# Patient Record
Sex: Male | Born: 1945
Health system: Southern US, Community
[De-identification: ages and names within clinical notes are randomized; demographics above are authoritative.]

## PROBLEM LIST (undated history)

## (undated) DIAGNOSIS — I2510 Atherosclerotic heart disease of native coronary artery without angina pectoris: Secondary | ICD-10-CM

## (undated) DIAGNOSIS — N319 Neuromuscular dysfunction of bladder, unspecified: Secondary | ICD-10-CM

## (undated) DIAGNOSIS — L601 Onycholysis: Secondary | ICD-10-CM

## (undated) HISTORY — DX: Atherosclerotic heart disease of native coronary artery without angina pectoris: I25.10

## (undated) HISTORY — PX: CATARACT EXTRACTION: SUR2

## (undated) HISTORY — DX: Neuromuscular dysfunction of bladder, unspecified: N31.9

## (undated) HISTORY — DX: Onycholysis: L60.1

---

## 2001-01-06 ENCOUNTER — Encounter: Payer: Self-pay | Admitting: Urology

## 2001-01-06 ENCOUNTER — Encounter: Admission: RE | Admit: 2001-01-06 | Discharge: 2001-01-06 | Payer: Self-pay | Admitting: Urology

## 2008-10-24 ENCOUNTER — Encounter: Payer: Self-pay | Admitting: Emergency Medicine

## 2008-10-24 ENCOUNTER — Ambulatory Visit: Payer: Self-pay | Admitting: Radiology

## 2008-10-24 ENCOUNTER — Ambulatory Visit: Payer: Self-pay | Admitting: *Deleted

## 2008-10-24 ENCOUNTER — Inpatient Hospital Stay (HOSPITAL_COMMUNITY): Admission: EM | Admit: 2008-10-24 | Discharge: 2008-10-28 | Payer: Self-pay | Admitting: Emergency Medicine

## 2008-10-26 ENCOUNTER — Encounter: Admission: RE | Admit: 2008-10-26 | Discharge: 2008-10-27 | Payer: Self-pay | Admitting: Cardiology

## 2008-10-26 ENCOUNTER — Encounter: Payer: Self-pay | Admitting: Cardiovascular Disease

## 2008-10-26 DIAGNOSIS — I2510 Atherosclerotic heart disease of native coronary artery without angina pectoris: Secondary | ICD-10-CM

## 2008-10-26 HISTORY — DX: Atherosclerotic heart disease of native coronary artery without angina pectoris: I25.10

## 2008-10-28 ENCOUNTER — Encounter: Payer: Self-pay | Admitting: Cardiology

## 2008-11-01 ENCOUNTER — Encounter: Payer: Self-pay | Admitting: Cardiovascular Disease

## 2008-11-05 DIAGNOSIS — I219 Acute myocardial infarction, unspecified: Secondary | ICD-10-CM | POA: Insufficient documentation

## 2008-11-05 DIAGNOSIS — R079 Chest pain, unspecified: Secondary | ICD-10-CM

## 2008-11-05 DIAGNOSIS — I2510 Atherosclerotic heart disease of native coronary artery without angina pectoris: Secondary | ICD-10-CM | POA: Insufficient documentation

## 2008-11-08 ENCOUNTER — Ambulatory Visit: Payer: Self-pay | Admitting: Cardiovascular Disease

## 2008-11-08 DIAGNOSIS — E785 Hyperlipidemia, unspecified: Secondary | ICD-10-CM | POA: Insufficient documentation

## 2008-11-08 DIAGNOSIS — I2510 Atherosclerotic heart disease of native coronary artery without angina pectoris: Secondary | ICD-10-CM

## 2008-11-11 ENCOUNTER — Encounter (HOSPITAL_COMMUNITY): Admission: RE | Admit: 2008-11-11 | Discharge: 2009-02-09 | Payer: Self-pay | Admitting: Cardiovascular Disease

## 2008-11-17 ENCOUNTER — Ambulatory Visit: Payer: Self-pay | Admitting: Cardiology

## 2008-11-30 ENCOUNTER — Encounter: Payer: Self-pay | Admitting: Cardiovascular Disease

## 2008-12-22 ENCOUNTER — Encounter: Payer: Self-pay | Admitting: Cardiovascular Disease

## 2009-01-04 ENCOUNTER — Encounter: Payer: Self-pay | Admitting: Cardiovascular Disease

## 2009-01-10 ENCOUNTER — Telehealth: Payer: Self-pay | Admitting: Cardiovascular Disease

## 2009-01-17 ENCOUNTER — Ambulatory Visit: Payer: Self-pay | Admitting: Cardiovascular Disease

## 2009-01-17 LAB — CONVERTED CEMR LAB
ALT: 33 units/L (ref 0–53)
AST: 32 units/L (ref 0–37)
Albumin: 4 g/dL (ref 3.5–5.2)
Alkaline Phosphatase: 47 units/L (ref 39–117)
Bilirubin, Direct: 0 mg/dL (ref 0.0–0.3)
Cholesterol: 134 mg/dL (ref 0–200)
HDL: 45.5 mg/dL (ref >39.00)
LDL Cholesterol: 79 mg/dL (ref 0–99)
Total Bilirubin: 0.6 mg/dL (ref 0.3–1.2)
Total CHOL/HDL Ratio: 3
Total CK: 214 units/L (ref 7–232)
Total Protein: 7.2 g/dL (ref 6.0–8.3)
Triglycerides: 50 mg/dL (ref 0.0–149.0)
VLDL: 10 mg/dL (ref 0.0–40.0)

## 2009-01-25 ENCOUNTER — Encounter: Payer: Self-pay | Admitting: Cardiovascular Disease

## 2009-02-10 ENCOUNTER — Encounter (HOSPITAL_COMMUNITY): Admission: RE | Admit: 2009-02-10 | Discharge: 2009-04-26 | Payer: Self-pay | Admitting: Cardiovascular Disease

## 2009-02-17 ENCOUNTER — Ambulatory Visit: Payer: Self-pay | Admitting: Cardiology

## 2009-02-18 ENCOUNTER — Encounter: Payer: Self-pay | Admitting: Cardiovascular Disease

## 2009-03-25 ENCOUNTER — Encounter: Payer: Self-pay | Admitting: Cardiovascular Disease

## 2009-04-15 ENCOUNTER — Ambulatory Visit: Payer: Self-pay | Admitting: Internal Medicine

## 2009-04-15 DIAGNOSIS — R634 Abnormal weight loss: Secondary | ICD-10-CM

## 2009-04-15 LAB — CONVERTED CEMR LAB
BUN: 17 mg/dL (ref 6–23)
Basophils Absolute: 0 10*3/uL (ref 0.0–0.1)
Basophils Relative: 0 % (ref 0–1)
CO2: 25 meq/L (ref 19–32)
Calcium: 10.2 mg/dL (ref 8.4–10.5)
Chloride: 103 meq/L (ref 96–112)
Cholesterol, target level: 200 mg/dL
Creatinine, Ser: 0.87 mg/dL (ref 0.40–1.50)
Eosinophils Absolute: 0.1 10*3/uL (ref 0.0–0.7)
Eosinophils Relative: 1 % (ref 0–5)
Free T4: 1.11 ng/dL (ref 0.80–1.80)
Glucose, Bld: 80 mg/dL (ref 70–99)
HCT: 45.1 % (ref 39.0–52.0)
HDL goal, serum: 40 mg/dL
Hemoglobin: 14.8 g/dL (ref 13.0–17.0)
LDL Goal: 70 mg/dL
Lymphocytes Relative: 31 % (ref 12–46)
Lymphs Abs: 1.4 10*3/uL (ref 0.7–4.0)
MCHC: 32.8 g/dL (ref 30.0–36.0)
MCV: 98.9 fL (ref 78.0–100.0)
Monocytes Absolute: 0.6 10*3/uL (ref 0.1–1.0)
Monocytes Relative: 13 % — ABNORMAL HIGH (ref 3–12)
Neutro Abs: 2.5 10*3/uL (ref 1.7–7.7)
Neutrophils Relative %: 54 % (ref 43–77)
PSA: 1.1 ng/mL (ref 0.10–4.00)
Platelets: 173 10*3/uL (ref 150–400)
Potassium: 5.1 meq/L (ref 3.5–5.3)
RBC: 4.56 M/uL (ref 4.22–5.81)
RDW: 13 % (ref 11.5–15.5)
Sodium: 140 meq/L (ref 135–145)
TSH: 1.512 microintl units/mL (ref 0.350–4.500)
WBC: 4.6 10*3/uL (ref 4.0–10.5)

## 2009-04-19 ENCOUNTER — Telehealth: Payer: Self-pay | Admitting: Internal Medicine

## 2009-04-19 ENCOUNTER — Ambulatory Visit: Payer: Self-pay | Admitting: Diagnostic Radiology

## 2009-04-19 ENCOUNTER — Ambulatory Visit (HOSPITAL_BASED_OUTPATIENT_CLINIC_OR_DEPARTMENT_OTHER): Admission: RE | Admit: 2009-04-19 | Discharge: 2009-04-19 | Payer: Self-pay | Admitting: Internal Medicine

## 2009-04-19 DIAGNOSIS — N312 Flaccid neuropathic bladder, not elsewhere classified: Secondary | ICD-10-CM

## 2009-04-29 ENCOUNTER — Encounter (INDEPENDENT_AMBULATORY_CARE_PROVIDER_SITE_OTHER): Payer: Self-pay | Admitting: *Deleted

## 2009-05-03 ENCOUNTER — Ambulatory Visit: Payer: Self-pay | Admitting: Cardiovascular Disease

## 2009-05-24 ENCOUNTER — Ambulatory Visit: Payer: Self-pay | Admitting: Internal Medicine

## 2009-06-22 ENCOUNTER — Ambulatory Visit: Payer: Self-pay | Admitting: Internal Medicine

## 2009-06-22 LAB — CONVERTED CEMR LAB: Fecal Occult Bld: NEGATIVE

## 2009-06-23 ENCOUNTER — Encounter: Payer: Self-pay | Admitting: Internal Medicine

## 2009-06-24 ENCOUNTER — Ambulatory Visit: Payer: Self-pay | Admitting: Cardiology

## 2009-06-24 ENCOUNTER — Encounter: Payer: Self-pay | Admitting: Cardiovascular Disease

## 2009-06-27 ENCOUNTER — Encounter: Payer: Self-pay | Admitting: Cardiovascular Disease

## 2009-08-09 ENCOUNTER — Encounter: Payer: Self-pay | Admitting: Internal Medicine

## 2009-08-23 ENCOUNTER — Encounter (INDEPENDENT_AMBULATORY_CARE_PROVIDER_SITE_OTHER): Payer: Self-pay | Admitting: *Deleted

## 2009-08-30 ENCOUNTER — Ambulatory Visit: Payer: Self-pay | Admitting: Internal Medicine

## 2009-08-30 DIAGNOSIS — N390 Urinary tract infection, site not specified: Secondary | ICD-10-CM

## 2009-08-30 LAB — CONVERTED CEMR LAB
Bilirubin Urine: NEGATIVE
Specific Gravity, Urine: <1.005
Urobilinogen, UA: 0.2
pH: 7

## 2009-09-13 ENCOUNTER — Ambulatory Visit: Payer: Self-pay | Admitting: Cardiovascular Disease

## 2009-10-14 ENCOUNTER — Ambulatory Visit: Payer: Self-pay | Admitting: Internal Medicine

## 2009-10-14 LAB — CONVERTED CEMR LAB
Glucose, Urine, Semiquant: NEGATIVE
Nitrite: POSITIVE
Protein, U semiquant: >=300

## 2009-10-17 ENCOUNTER — Telehealth: Payer: Self-pay | Admitting: Internal Medicine

## 2009-10-21 ENCOUNTER — Emergency Department (HOSPITAL_BASED_OUTPATIENT_CLINIC_OR_DEPARTMENT_OTHER): Admission: EM | Admit: 2009-10-21 | Discharge: 2009-10-21 | Payer: Self-pay | Admitting: Emergency Medicine

## 2009-11-10 ENCOUNTER — Ambulatory Visit: Payer: Self-pay | Admitting: Family

## 2009-11-11 ENCOUNTER — Encounter: Payer: Self-pay | Admitting: Internal Medicine

## 2010-03-06 ENCOUNTER — Ambulatory Visit: Payer: Self-pay | Admitting: Cardiovascular Disease

## 2010-06-27 NOTE — Assessment & Plan Note (Signed)
Summary: ?uti / TF,CMA--Rm 4   Vital Signs:  Patient profile:   65 year old male Height:      74.5 inches Weight:      174.50 pounds BMI:     22.18 Temp:     97.6 degrees F oral Pulse rate:   66 / minute Pulse rhythm:   regular Resp:     12 per minute BP sitting:   112 / 72  (right arm) Cuff size:   regular  Vitals Entered By: Mervin Kung CMA (November 10, 2009 8:33 AM) CC: Room 4   Burning with urination and frequency since this a.m. Is Patient Diabetic? No Comments Pt has completed cleocin and sulfamethoxazole.   Primary Care Provider:  Dondra Spry DO  CC:  Room 4   Burning with urination and frequency since this a.m..  History of Present Illness: Connor Walker is a 65 year old male with history of neurogenic bladder and recurrent UTI's who presents today with dysuria and frequency which started this AM.  He performs self catheterization twice daily. Denies associated fever, low back pain or hematuria.  Patient   Allergies (verified): No Known Drug Allergies  Past History:  Past Medical History: Last updated: 10/26/09 NSTEMI May 2010 CAD native vessel with drug eluting stents in RCA and Circumflex June 2010 Onycomycosis   Neurogenic bladder     Past Surgical History: Last updated: Oct 26, 2009 Cataract surgery bilateral          Family History: Last updated: 10-26-2009  His mother died of cancer.   His father died of complications from carotid endarterectomy.   No CAD Brother alive with leukemia Sister HTN       Social History: Last updated: 10-26-09 Full Time-[employer] Married, 4 children  (Wife - Connor Walker, works at [employer]) Tobacco Use - No.  Alcohol Use - yes.Marland Kitchenon the weekends Drug Use - no       Risk Factors: Caffeine Use: 2-3 cups coffee/day (04/15/2009) Exercise: yes (04/15/2009)  Risk Factors: Smoking Status: never (04/15/2009)  Physical Exam  General:  Well-developed,well-nourished,in no acute distress;  alert,appropriate and cooperative throughout examination Lungs:  Normal respiratory effort, chest expands symmetrically. Lungs are clear to auscultation, no crackles or wheezes. Heart:  Normal rate and regular rhythm. S1 and S2 normal without gallop, murmur, click, rub or other extra sounds. Msk:  negative CVAT   Impression & Recommendations:  Problem # 1:  UTI (ICD-599.0) Assessment New  Last urine culture was sensitive to cipro.  Will start on cipro and adjust as needed based on culture results.  Patient will return this afternoon with sample (unable to go here in office)  Pt instructed not to start Cipro until he obtains sample.  Also- recommended to patient that he try catheterizing three times a day as he reports that he always has significant PVR.  Urinary retention/PVR may be contributing to his UTI's.  He reports that he uses sterile technique and a new sterile cath each time.   The following medications were removed from the medication list:    Cleocin 150 Mg Caps (Clindamycin hcl) ..... One by mouth three times a day    Sulfamethoxazole-tmp Ds 800-160 Mg Tabs (Sulfamethoxazole-trimethoprim) ..... One by mouth two times a day His updated medication list for this problem includes:    Cipro 500 Mg Tabs (Ciprofloxacin hcl) ..... One tablet by mouth two times a day x 7 days  Orders: T-Culture, Urine ([id number])  Complete Medication List: 1)  Simvastatin 40 Mg Tabs (Simvastatin) .Marland Kitchen.. 1 tab once daily 2)  Plavix 75 Mg Tabs (Clopidogrel bisulfate) .Marland Kitchen.. 1 tab once daily 3)  Metoprolol Tartrate 25 Mg Tabs (Metoprolol tartrate) .... 1/2 tab two times a day 4)  Aspirin 81 Mg Tbec (Aspirin) .... Once daily 5)  Zostavax 16109 Unt/0.52ml Solr (Zoster vaccine live) .... Administer vaccine x 1 6)  Nitrostat 0.4 Mg Subl (Nitroglycerin) .Marland Kitchen.. 1 tablet under tongue at onset of chest pain; you may repeat every 5 minutes for up to 3 doses. 7)  Cipro 500 Mg Tabs (Ciprofloxacin hcl) .... One tablet by  mouth two times a day x 7 days  Patient Instructions: 1)  Please return this afternoon with a urine sample. 2)  Call if fever over 101, low back back, weakness, or blood in your urine. 3)  Try catheterizing 3 times daily. Prescriptions: CIPRO 500 MG TABS (CIPROFLOXACIN HCL) one tablet by mouth two times a day x 7 days  #14 x 0   Entered and Authorized by:   Lemont Fillers FNP   Signed by:   Lemont Fillers FNP on 11/10/2009   Method used:   Electronically to        [facility] [phone]* (retail)       [address] [place], [place]  [address]       Ph: [phone]       Fax: [phone]   RxID:   [id number]   Current Allergies (reviewed today): No known allergies

## 2010-06-27 NOTE — Letter (Signed)
     June 23, 2009   [name] [address] [place], [place] [address]  RE:  LAB RESULTS  Dear  [name],  The following is an interpretation of your most recent lab tests.  Please take note of any instructions provided or changes to medications that have resulted from your lab work.  Hemoccult Test for blood in stool:  negative         Sincerely Yours,    Dr. Thomos Lemons

## 2010-06-27 NOTE — Assessment & Plan Note (Signed)
Summary: UTI/MHF   Vital Signs:  Patient profile:   65 year old male Weight:      175 pounds BMI:     22.25 O2 Sat:      99 % on Room air Temp:     98.5 degrees F oral Pulse rate:   88 / minute Pulse rhythm:   regular Resp:     18 per minute BP sitting:   110 / 60  (right arm) Cuff size:   large  Vitals Entered By: Glendell Docker CMA (Oct 14, 2009 1:24 PM)  O2 Flow:  Room air CC: Rm 3- UTI   Primary Care Provider:  DThomos Lemons DO  CC:  Rm 3- UTI.  History of Present Illness: 65 y/o white male with hx of neurogenic bladder c/ o pain with urination  and frequency  4th epsiode this year, sudden onset this am of urinary pain after last episode, he was seen by Dr. Earlene Plater he recommended pt continue two times a day self cath he uses aspeptic technique  Allergies (verified): No Known Drug Allergies  Past History:  Past Medical History: NSTEMI May 2010 CAD native vessel with drug eluting stents in RCA and Circumflex June 2010 Onycomycosis   Neurogenic bladder     Past Surgical History: Cataract surgery bilateral          Family History:  His mother died of cancer.   His father died of complications from carotid endarterectomy.   No CAD Brother alive with leukemia Sister HTN       Social History: Full Time-[employer] Married, 4 children  (Wife - Deborrah, works at [employer]) Tobacco Use - No.  Alcohol Use - yes.Marland Kitchenon the weekends Drug Use - no       Physical Exam  General:  alert, well-developed, and well-nourished.   Lungs:  normal respiratory effort and normal breath sounds.   Heart:  normal rate, regular rhythm, and no gallop.   Abdomen:  no flank tendernesssoft, non-tender, and normal bowel sounds.     Impression & Recommendations:  Problem # 1:  UTI (ICD-599.0) 65 y/o with hx of neurogenic bladder with recurrent UTI.  tx with cipro.  check culture.  Patient advised to call office if symptoms persist or worsen.  His updated  medication list for this problem includes:    Cleocin 150 Mg Caps (Clindamycin hcl) ..... One by mouth three times a day    Sulfamethoxazole-tmp Ds 800-160 Mg Tabs (Sulfamethoxazole-trimethoprim) ..... One by mouth two times a day  Orders: T-Culture, Urine ([id number]) Specimen Handling ([address]) UA Dipstick w/o Micro (manual) (81002)  Complete Medication List: 1)  Simvastatin 40 Mg Tabs (Simvastatin) .Marland Kitchen.. 1 tab once daily 2)  Plavix 75 Mg Tabs (Clopidogrel bisulfate) .Marland Kitchen.. 1 tab once daily 3)  Metoprolol Tartrate 25 Mg Tabs (Metoprolol tartrate) .... 1/2 tab two times a day 4)  Aspirin 81 Mg Tbec (Aspirin) .... Once daily 5)  Zostavax 86578 Unt/0.82ml Solr (Zoster vaccine live) .... Administer vaccine x 1 6)  Nitrostat 0.4 Mg Subl (Nitroglycerin) .Marland Kitchen.. 1 tablet under tongue at onset of chest pain; you may repeat every 5 minutes for up to 3 doses. 7)  Cleocin 150 Mg Caps (Clindamycin hcl) .... One by mouth three times a day 8)  Sulfamethoxazole-tmp Ds 800-160 Mg Tabs (Sulfamethoxazole-trimethoprim) .... One by mouth two times a day  Patient Instructions: 1)  Go to the ER over the weekend if your symptoms get worse. Prescriptions: CIPROFLOXACIN HCL 500  MG TABS (CIPROFLOXACIN HCL) one by mouth bid  #14 x 0   Entered and Authorized by:   D. Thomos Lemons DO   Signed by:   D. Thomos Lemons DO on 10/14/2009   Method used:   Electronically to        [facility] [address] [facility] [address] [phone]* (retail)       [address]       [place], [place]  [address]       Ph: [phone] or [phone]       Fax: [phone]   RxID:   [id number]   Current Allergies (reviewed today): No known allergies   Laboratory Results   Urine Tests    Routine Urinalysis   Color: green Appearance: Cloudy Glucose: negative   (Normal Range: Negative) Bilirubin: negative   (Normal Range: Negative) Ketone: negative   (Normal Range: Negative) Spec. Gravity: 1.020   (Normal Range:  1.003-1.035) Blood: large   (Normal Range: Negative) pH: 5.0   (Normal Range: 5.0-8.0) Protein: >=300   (Normal Range: Negative) Urobilinogen: 0.2   (Normal Range: 0-1) Nitrite: positive   (Normal Range: Negative) Leukocyte Esterace: large   (Normal Range: Negative)

## 2010-06-27 NOTE — Letter (Signed)
Summary: [facility]  [facility]   Imported By: Lanelle Bal 06/08/2009 10:29:53  _____________________________________________________________________  External Attachment:    Type:   Image     Comment:   External Document

## 2010-06-27 NOTE — Progress Notes (Signed)
Summary: Status Update-No improvement  Phone Note Call from Patient Call back at Home Phone [phone]   Caller: Patient Call For: D. Thomos Lemons DO Summary of Call: patient called and left voice message stating his urinary symptoms have not improved. He is still having pain with urination, frequency every hour. His message states that he has started the antibiotics on Friday and thought that he may feel better in a coulple of days,but he does not. He would like to know if he should continue with the current antibiotics or try something different Initial call taken by: Glendell Docker CMA,  Oct 17, 2009 1:17 PM  Follow-up for Phone Call        stop cipro. take clinda and bactrim as directed.   if no improvement within 24 hrs,  go to ER Follow-up by: D. Thomos Lemons DO,  Oct 17, 2009 5:49 PM  Additional Follow-up for Phone Call Additional follow up Details #1::        patient has been advised per Dr Artist Pais instructions Additional Follow-up by: Glendell Docker CMA,  Oct 17, 2009 5:51 PM    New/Updated Medications: CLEOCIN 150 MG CAPS (CLINDAMYCIN HCL) one by mouth three times a day SULFAMETHOXAZOLE-TMP DS 800-160 MG TABS (SULFAMETHOXAZOLE-TRIMETHOPRIM) one by mouth two times a day Prescriptions: SULFAMETHOXAZOLE-TMP DS 800-160 MG TABS (SULFAMETHOXAZOLE-TRIMETHOPRIM) one by mouth two times a day  #14 x 0   Entered and Authorized by:   D. Thomos Lemons DO   Signed by:   D. Thomos Lemons DO on 10/17/2009   Method used:   Electronically to        [id number]* (retail)       [address] [place], [place]  [address]       Ph: [phone]       Fax: [phone]   RxID:   [id number] CLEOCIN 150 MG CAPS (CLINDAMYCIN HCL) one by mouth three times a day  #21 x 0   Entered and Authorized by:   D. Thomos Lemons DO   Signed by:   D. Thomos Lemons DO on 10/17/2009   Method used:   Electronically to        [id number]* (retail)       [address] [place], [place]  [address]       Ph: [phone]       Fax: [phone]   RxID:   [id number]

## 2010-06-27 NOTE — Assessment & Plan Note (Signed)
Summary: uti/mhf   Vital Signs:  Patient profile:   65 year old male Height:      74.5 inches Weight:      172 pounds BMI:     21.87 O2 Sat:      100 % on Room air Temp:     98.0 degrees F oral Pulse rate:   56 / minute Pulse rhythm:   regular Resp:     16 per minute BP sitting:   100 / 60  (right arm) Cuff size:   regular  Vitals Entered By: Glendell Docker CMA (August 30, 2009 11:13 AM)  O2 Flow:  Room air CC: Rm 2- UTI   Primary Care Provider:  Dondra Spry DO  CC:  Rm 2- UTI.  History of Present Illness: 65 y/o white male with hx of neurogenic bladder c/o urinary pressure, pain with urination and urinary frequency, onset yesterday afternoon. He has been taking Urelle with some relief from pain   urine Dip positive for UTI  he is followed by urologist - Dr. Earlene Plater he has been self cathetrizing himself,  good sterile technique  Allergies (verified): No Known Drug Allergies  Past History:  Past Medical History: MI (ICD-410.90) CHEST PAIN-UNSPECIFIED (ICD-786.50) CAD, UNSPECIFIED SITE (ICD-414.00) Onycomycosis   Neurogenic bladder    Past Surgical History: Cataract surgery bilateral         Family History:  His mother died of cancer.   His father died of complications from carotid endarterectomy.   No CAD Brother alive with leukemia Sister HTN      Social History: Full Time-[employer] Married, 4 children  (Wife - Deborrah, works at [employer]) Tobacco Use - No.  Alcohol Use - yes.Marland Kitchenon the weekends Drug Use - no      Physical Exam  General:  alert, well-developed, and well-nourished.  non toxic Lungs:  normal respiratory effort and normal breath sounds.   Heart:  normal rate, regular rhythm, no murmur, and no gallop.   Abdomen:  soft.  lower abd fullness and mild tenderness, normal bowel sounds, no rigidity, and no rebound tenderness.     Impression & Recommendations:  Problem # 1:  UTI (ICD-599.0) 65 y/o with hx of neurogenic  bladder,  followed by urologist,   who self caths has UTI. tx with cipro.  pt advised to f/u with urologist. hold asa as ua is positive for blood.   Patient advised to call office if symptoms persist or worsen.    His updated medication list for this problem includes:    Urelle 81 Mg Tabs (Meth-hyo-m bl-na phos-ph sal) .Marland Kitchen... Take 1 tablet by mouth three times a day as needed    Ciprofloxacin Hcl 500 Mg Tabs (Ciprofloxacin hcl) ..... One by mouth two times a day  Complete Medication List: 1)  Simvastatin 40 Mg Tabs (Simvastatin) .Marland Kitchen.. 1 tab once daily 2)  Plavix 75 Mg Tabs (Clopidogrel bisulfate) .Marland Kitchen.. 1 tab once daily 3)  Metoprolol Tartrate 25 Mg Tabs (Metoprolol tartrate) .... 1/2 tab two times a day 4)  Aspirin 81 Mg Tbec (Aspirin) .... Once daily 5)  Zostavax 14782 Unt/0.58ml Solr (Zoster vaccine live) .... Administer vaccine x 1 6)  Urelle 81 Mg Tabs (Meth-hyo-m bl-na phos-ph sal) .... Take 1 tablet by mouth three times a day as needed 7)  Ciprofloxacin Hcl 500 Mg Tabs (Ciprofloxacin hcl) .... One by mouth two times a day  Other Orders: T-Culture, Urine ([id number]) UA Dipstick w/o Micro (manual) (78469)  Specimen Handling ([address])  Patient Instructions: 1)  Call our office if your symptoms do not  improve or gets worse. 2)  Hold aspirin until UTI has resolved. 3)  Call your urologist for follow up appointment Prescriptions: CIPROFLOXACIN HCL 500 MG TABS (CIPROFLOXACIN HCL) one by mouth two times a day  #20 x 0   Entered and Authorized by:   D. Thomos Lemons DO   Signed by:   D. Thomos Lemons DO on 08/30/2009   Method used:   Electronically to        [id number]* (retail)       [address] [place], [place]  [address]       Ph: [phone]       Fax: [phone]   RxID:   [id number]   Current Allergies (reviewed today): No known allergies     Laboratory Results   Urine Tests    Routine Urinalysis   Color:  green Appearance: Clear Glucose: negative   (Normal Range: Negative) Bilirubin: negative   (Normal Range: Negative) Ketone: negative   (Normal Range: Negative) Spec. Gravity: <1.005   (Normal Range: 1.003-1.035) Blood: large   (Normal Range: Negative) pH: 7.0   (Normal Range: 5.0-8.0) Protein: 100   (Normal Range: Negative) Urobilinogen: 0.2   (Normal Range: 0-1) Nitrite: negative   (Normal Range: Negative) Leukocyte Esterace: large   (Normal Range: Negative)

## 2010-06-27 NOTE — Miscellaneous (Signed)
  Clinical Lists Changes  Observations: Added new observation of RS STUDY: TRACER - study completion 06/24/09 (08/23/2009 12:17)      Research Study Name: TRACER - study completion 06/24/09

## 2010-06-27 NOTE — Letter (Signed)
Summary: [facility]  [facility]   Imported By: Lanelle Bal 08/16/2009 08:48:05  _____________________________________________________________________  External Attachment:    Type:   Image     Comment:   External Document

## 2010-06-27 NOTE — Assessment & Plan Note (Signed)
Summary: 6 month follow up/414.01/pla   Visit Type:  6 mo f/u Primary Provider:  Dondra Spry DO  CC:  no cardiac complaints today.  History of Present Illness: 65 yo WM with CAD here today for follow up. He was admitted from Oct 24, 2008 until October 28, 2008 with a NSTEMI. I placed drug eluting stents in the proximal and mid RCA and in the mid Circumflex. He is here today for f/u. No chest pain, SOB, palpitations, dizziness, near syncope or syncope. He has been tolerating his medications well. He has been walking each day.   Current Medications (verified): 1)  Simvastatin 40 Mg Tabs (Simvastatin) .Marland Kitchen.. 1 Tab Once Daily 2)  Plavix 75 Mg Tabs (Clopidogrel Bisulfate) .Marland Kitchen.. 1 Tab Once Daily 3)  Metoprolol Tartrate 25 Mg Tabs (Metoprolol Tartrate) .... 1/2 Tab Two Times A Day 4)  Aspirin 81 Mg Tbec (Aspirin) .... Once Daily 5)  Zostavax 16109 Unt/0.40ml Solr (Zoster Vaccine Live) .... Administer Vaccine X 1 6)  Nitrostat 0.4 Mg Subl (Nitroglycerin) .Marland Kitchen.. 1 Tablet Under Tongue At Onset of Chest Pain; You May Repeat Every 5 Minutes For Up To 3 Doses.  Allergies (verified): No Known Drug Allergies  Past History:  Past Medical History: NSTEMI May 2010 CAD native vessel with drug eluting stents in RCA and Circumflex June 2010 Onycomycosis   Neurogenic bladder    Social History: Reviewed history from 08/30/2009 and no changes required. Full Time-[employer] Married, 4 children  (Wife - Deborrah, works at [employer]) Tobacco Use - No.  Alcohol Use - yes.Marland Kitchenon the weekends Drug Use - no      Review of Systems  The patient denies fatigue, malaise, fever, weight gain/loss, vision loss, decreased hearing, hoarseness, chest pain, palpitations, shortness of breath, prolonged cough, wheezing, sleep apnea, coughing up blood, abdominal pain, blood in stool, nausea, vomiting, diarrhea, heartburn, incontinence, blood in urine, muscle weakness, joint pain, leg swelling, rash, skin lesions,  headache, fainting, dizziness, depression, anxiety, enlarged lymph nodes, easy bruising or bleeding, and environmental allergies.    Vital Signs:  Patient profile:   65 year old male Height:      74.5 inches Weight:      170 pounds BMI:     21.61 Pulse rate:   60 / minute Pulse rhythm:   regular BP sitting:   102 / 70  (left arm) Cuff size:   regular  Vitals Entered By: Danielle Rankin, CMA (September 13, 2009 8:46 AM)  Physical Exam  General:  General: Well developed, well nourished, NAD Musculoskeletal: Muscle strength 5/5 all ext Psychiatric: Mood and affect normal Neck: No JVD, no carotid bruits, no thyromegaly, no lymphadenopathy. Lungs:Clear bilaterally, no wheezes, rhonci, crackles CV: RRR no murmurs, gallops rubs Abdomen: soft, NT, ND, BS present Extremities: No edema, pulses 2+.    Impression & Recommendations:  Problem # 1:  CAD, NATIVE VESSEL (ICD-414.01) Stable. No chest pain. Continue ASA and Plavix for at least one year. Continue beta blocker and statin. Lipids from January 2011 with LDL of 71, total cholesterol 152, HDL 66. Continue daily exercise and heart healthy diet.   His updated medication list for this problem includes:    Plavix 75 Mg Tabs (Clopidogrel bisulfate) .Marland Kitchen... 1 tab once daily    Metoprolol Tartrate 25 Mg Tabs (Metoprolol tartrate) .Marland Kitchen... 1/2 tab two times a day    Aspirin 81 Mg Tbec (Aspirin) ..... Once daily    Nitrostat 0.4 Mg Subl (Nitroglycerin) .Marland Kitchen... 1 tablet under  tongue at onset of chest pain; you may repeat every 5 minutes for up to 3 doses.  Patient Instructions: 1)  Your physician recommends that you schedule a follow-up appointment in: 6 months 2)  Your physician recommends that you continue on your current medications as directed. Please refer to the Current Medication list given to you today.

## 2010-06-27 NOTE — Assessment & Plan Note (Signed)
Summary: per check out/sf   Visit Type:  6 mo f/u Primary Provider:  Dondra Spry DO  CC:  no cardiac complaints today.  History of Present Illness: 65 yo Walker with CAD here today for follow up. He was admitted from Oct 24, 2008 until October 28, 2008 with a NSTEMI. I placed drug eluting stents in the proximal and mid RCA and in the mid Circumflex. He is here today for f/u. No chest pain, SOB, palpitations, dizziness, near syncope or syncope. He has been tolerating his medications well. He has been working full time without limitations.   Most recent lipids January 2011 with Total chol: 152, HDL: 66, LDL: 71.   Current Medications (verified): 1)  Simvastatin 40 Mg Tabs (Simvastatin) .Marland Kitchen.. 1 Tab Once Daily 2)  Plavix 75 Mg Tabs (Clopidogrel Bisulfate) .Marland Kitchen.. 1 Tab Once Daily 3)  Metoprolol Tartrate 25 Mg Tabs (Metoprolol Tartrate) .... 1/2 Tab Two Times A Day 4)  Aspirin 81 Mg Tbec (Aspirin) .... Once Daily 5)  Zostavax 84696 Unt/0.74ml Solr (Zoster Vaccine Live) .... Administer Vaccine X 1 6)  Nitrostat 0.4 Mg Subl (Nitroglycerin) .Marland Kitchen.. 1 Tablet Under Tongue At Onset of Chest Pain; You May Repeat Every 5 Minutes For Up To 3 Doses.  Allergies (verified): No Known Drug Allergies  Past History:  Past Medical History: Reviewed history from 05/Connor/2011 and no changes required. NSTEMI May 2010 CAD native vessel with drug eluting stents in RCA and Circumflex June 2010 Onycomycosis   Neurogenic bladder     Social History: Reviewed history from 05/Connor/2011 and no changes required. Full Time-[employer] Married, 4 children  (Wife - Deborrah, works at [employer] cancer [employer]) Tobacco Use - No.  Alcohol Use - yes.Marland Kitchenon the weekends Drug Use - no       Review of Systems  The patient denies fatigue, malaise, fever, weight gain/loss, vision loss, decreased hearing, hoarseness, chest pain, palpitations, shortness of breath, prolonged cough, wheezing, sleep apnea, coughing up blood, abdominal pain,  blood in stool, nausea, vomiting, diarrhea, heartburn, incontinence, blood in urine, muscle weakness, joint pain, leg swelling, rash, skin lesions, headache, fainting, dizziness, depression, anxiety, enlarged lymph nodes, easy bruising or bleeding, and environmental allergies.    Vital Signs:  Patient profile:   65 year old male Height:      74.5 inches Weight:      174.4 pounds BMI:     22.17 Pulse rate:   58 / minute Pulse rhythm:   irregular BP sitting:   96 / 62  (left arm) Cuff size:   large  Vitals Entered By: Danielle Rankin, CMA (March 06, 2010 8:54 AM)  Physical Exam  General:  General: Well developed, well nourished, NAD Musculoskeletal: Muscle strength 5/5 all ext Psychiatric: Mood and affect normal Neck: No JVD, no carotid bruits, no thyromegaly, no lymphadenopathy. Lungs:Clear bilaterally, no wheezes, rhonci, crackles CV: RRR no murmurs, gallops rubs Abdomen: soft, NT, ND, BS present Extremities: No edema, pulses 2+.    EKG  Procedure date:  03/06/2010  Findings:      Sinus bradycardia, rate 58 bpm.   Impression & Recommendations:  Problem # 1:  CAD, NATIVE VESSEL (ICD-414.01) Stable. No angina. Will continue current meds. He will remain on dual antiplatelet therapy for the next 6 months.  We discussed stopping this medication today but he is having no financial difficulty and no bleeding problems so we will continue. Will check echo in 6 months.   His updated medication list for this problem  includes:    Plavix 75 Mg Tabs (Clopidogrel bisulfate) .Marland Kitchen... 1 tab once daily    Metoprolol Tartrate 25 Mg Tabs (Metoprolol tartrate) .Marland Kitchen... 1/2 tab two times a day    Aspirin 81 Mg Tbec (Aspirin) ..... Once daily    Nitrostat 0.4 Mg Subl (Nitroglycerin) .Marland Kitchen... 1 tablet under tongue at onset of chest pain; you may repeat every 5 minutes for up to 3 doses.  Other Orders: EKG w/ Interpretation (93000)  Patient Instructions: 1)  Your physician recommends that you schedule  a follow-up appointment in: 6months 2)  Your physician recommends that you continue on your current medications as directed. Please refer to the Current Medication list given to you today. 3)  Your physician has requested that you have an echocardiogram in 6 months.  Echocardiography is a painless test that uses sound waves to create images of your heart. It provides your doctor with information about the size and shape of your heart and how well your heart's chambers and valves are working.  This procedure takes approximately one hour. There are no restrictions for this procedure.

## 2010-07-03 ENCOUNTER — Telehealth: Payer: Self-pay | Admitting: Internal Medicine

## 2010-07-03 ENCOUNTER — Encounter: Payer: Self-pay | Admitting: Internal Medicine

## 2010-07-03 DIAGNOSIS — N390 Urinary tract infection, site not specified: Secondary | ICD-10-CM

## 2010-07-03 LAB — CONVERTED CEMR LAB
Glucose, Urine, Semiquant: NEGATIVE
Ketones, urine, test strip: NEGATIVE
Specific Gravity, Urine: 1.015
pH: 6.5

## 2010-07-13 NOTE — Progress Notes (Signed)
Summary: uti?  Phone Note Call from Patient   Caller: patient wife Call For: Rashelle Ireland Summary of Call: Gavin Pound came by this am to ask if she could drop off a speciman as patient feels he has a UTI.  Pt having frequent urination,  pressure and dysuria. Pt just started a new job and is difficult to come in right now. Pt's wife dropped a specimen off this a.m. Is this ok or do you need to see the pt for evaluation? Call pt's home number, if no answer call pt's wife at ext [phone]. Initial call taken by: Mervin Kung CMA Duncan Dull),  July 03, 2010 10:28 AM  Follow-up for Phone Call        plz confirm pt not having fever or back pain send urine for culture is pt still self catheterizing for neurogenic bladder  see rx for cipro needs OV if no improvement within 24-48 hrs  Follow-up by: D. Thomos Lemons DO,  July 03, 2010 1:15 PM  Additional Follow-up for Phone Call Additional follow up Details #1::        Unable to reach pt. Notified Deborah per Dr Olegario Messier instruction. She confirms that pt has not mentioned back pain and doesn't think he has had a fever; verified that pt still self caths.  Additional Follow-up by: Mervin Kung CMA (AAMA),  July 03, 2010 2:20 PM    New/Updated Medications: CIPROFLOXACIN HCL 500 MG TABS (CIPROFLOXACIN HCL) one by mouth two times a day Prescriptions: CIPROFLOXACIN HCL 500 MG TABS (CIPROFLOXACIN HCL) one by mouth two times a day  #14 x 0   Entered and Authorized by:   D. Thomos Lemons DO   Signed by:   D. Thomos Lemons DO on 07/03/2010   Method used:   Electronically to        [id number] (retail)       [address] [place], [place]  [address]       Ph: [phone]       Fax: [phone]   RxID:   [id number]   Laboratory Results   Urine Tests   Date/Time Reported: Mervin Kung CMA (AAMA)  July 03, 2010 2:15 PM   Routine Urinalysis   Color: yellow Appearance: Cloudy Glucose: negative    (Normal Range: Negative) Bilirubin: negative   (Normal Range: Negative) Ketone: negative   (Normal Range: Negative) Spec. Gravity: 1.015   (Normal Range: 1.003-1.035) Blood: trace-intact   (Normal Range: Negative) pH: 6.5   (Normal Range: 5.0-8.0) Protein: negative   (Normal Range: Negative) Urobilinogen: 0.2   (Normal Range: 0-1) Nitrite: positive   (Normal Range: Negative) Leukocyte Esterace: moderate   (Normal Range: Negative)    Comments: Urine Cultured.

## 2010-08-14 LAB — URINALYSIS, ROUTINE W REFLEX MICROSCOPIC
Bilirubin Urine: NEGATIVE
Nitrite: NEGATIVE
Protein, ur: NEGATIVE mg/dL
Specific Gravity, Urine: 1.01 (ref 1.005–1.030)
Urobilinogen, UA: 0.2 mg/dL (ref 0.0–1.0)

## 2010-09-04 LAB — BASIC METABOLIC PANEL
BUN: 7 mg/dL (ref 6–23)
BUN: 8 mg/dL (ref 6–23)
CO2: 27 mEq/L (ref 19–32)
CO2: 27 mEq/L (ref 19–32)
CO2: 30 mEq/L (ref 19–32)
Calcium: 8.7 mg/dL (ref 8.4–10.5)
Chloride: 106 mEq/L (ref 96–112)
Creatinine, Ser: 0.75 mg/dL (ref 0.4–1.5)
GFR calc non Af Amer: >60 mL/min (ref >60)
GFR calc non Af Amer: >60 mL/min (ref >60)
Glucose, Bld: 100 mg/dL — ABNORMAL HIGH (ref 70–99)
Glucose, Bld: 104 mg/dL — ABNORMAL HIGH (ref 70–99)
Glucose, Bld: 104 mg/dL — ABNORMAL HIGH (ref 70–99)
Potassium: 3.8 mEq/L (ref 3.5–5.1)
Potassium: 4 mEq/L (ref 3.5–5.1)
Sodium: 139 mEq/L (ref 135–145)
Sodium: 143 mEq/L (ref 135–145)

## 2010-09-04 LAB — CBC
HCT: 37.7 % — ABNORMAL LOW (ref 39.0–52.0)
Hemoglobin: 12.5 g/dL — ABNORMAL LOW (ref 13.0–17.0)
Hemoglobin: 12.9 g/dL — ABNORMAL LOW (ref 13.0–17.0)
Hemoglobin: 13.1 g/dL (ref 13.0–17.0)
MCHC: 34.2 g/dL (ref 30.0–36.0)
MCHC: 34.7 g/dL (ref 30.0–36.0)
Platelets: 147 10*3/uL — ABNORMAL LOW (ref 150–400)
RBC: 3.78 MIL/uL — ABNORMAL LOW (ref 4.22–5.81)
RBC: 3.94 MIL/uL — ABNORMAL LOW (ref 4.22–5.81)
RDW: 13.3 % (ref 11.5–15.5)
RDW: 13.5 % (ref 11.5–15.5)

## 2010-09-04 LAB — CARDIAC PANEL(CRET KIN+CKTOT+MB+TROPI)
CK, MB: 29.4 ng/mL — ABNORMAL HIGH (ref 0.3–4.0)
CK, MB: 87.5 ng/mL — ABNORMAL HIGH (ref 0.3–4.0)
Relative Index: 4.3 — ABNORMAL HIGH (ref 0.0–2.5)
Relative Index: 7 — ABNORMAL HIGH (ref 0.0–2.5)
Total CK: 767 U/L — ABNORMAL HIGH (ref 7–232)

## 2010-09-04 LAB — DIFFERENTIAL
Basophils Absolute: 0 10*3/uL (ref 0.0–0.1)
Lymphocytes Relative: 14 % (ref 12–46)
Monocytes Absolute: 0.6 10*3/uL (ref 0.1–1.0)
Monocytes Relative: 9 % (ref 3–12)
Neutro Abs: 4.6 10*3/uL (ref 1.7–7.7)

## 2010-09-04 LAB — HEPARIN LEVEL (UNFRACTIONATED): Heparin Unfractionated: 0.5 IU/mL (ref 0.30–0.70)

## 2010-09-05 LAB — BASIC METABOLIC PANEL
BUN: 13 mg/dL (ref 6–23)
CO2: 27 mEq/L (ref 19–32)
Calcium: 8.5 mg/dL (ref 8.4–10.5)
Chloride: 103 mEq/L (ref 96–112)
Chloride: 105 mEq/L (ref 96–112)
Creatinine, Ser: 0.89 mg/dL (ref 0.4–1.5)
GFR calc non Af Amer: >60 mL/min (ref >60)
Glucose, Bld: 122 mg/dL — ABNORMAL HIGH (ref 70–99)
Potassium: 3.9 mEq/L (ref 3.5–5.1)
Sodium: 143 mEq/L (ref 135–145)

## 2010-09-05 LAB — CBC
HCT: 44 % (ref 39.0–52.0)
Hemoglobin: 12.4 g/dL — ABNORMAL LOW (ref 13.0–17.0)
Hemoglobin: 15 g/dL (ref 13.0–17.0)
MCHC: 34.7 g/dL (ref 30.0–36.0)
MCV: 95.4 fL (ref 78.0–100.0)
MCV: 96.4 fL (ref 78.0–100.0)
RBC: 3.69 MIL/uL — ABNORMAL LOW (ref 4.22–5.81)
RBC: 4.61 MIL/uL (ref 4.22–5.81)
WBC: 5.7 10*3/uL (ref 4.0–10.5)
WBC: 6.9 10*3/uL (ref 4.0–10.5)

## 2010-09-05 LAB — DIFFERENTIAL
Eosinophils Absolute: 0.1 10*3/uL (ref 0.0–0.7)
Eosinophils Relative: 2 % (ref 0–5)
Lymphocytes Relative: 29 % (ref 12–46)
Lymphs Abs: 2 10*3/uL (ref 0.7–4.0)
Monocytes Absolute: 0.8 10*3/uL (ref 0.1–1.0)
Monocytes Relative: 12 % (ref 3–12)

## 2010-09-05 LAB — CK TOTAL AND CKMB (NOT AT ARMC)
CK, MB: 14.9 ng/mL — ABNORMAL HIGH (ref 0.3–4.0)
Total CK: 279 U/L — ABNORMAL HIGH (ref 7–232)

## 2010-09-05 LAB — POCT CARDIAC MARKERS
CKMB, poc: 3.2 ng/mL (ref 1.0–8.0)
Myoglobin, poc: 88.4 ng/mL (ref 12–200)

## 2010-09-05 LAB — LIPID PANEL
Cholesterol: 197 mg/dL (ref 0–200)
HDL: 34 mg/dL — ABNORMAL LOW (ref >39)
Total CHOL/HDL Ratio: 5.8 RATIO
Triglycerides: 264 mg/dL — ABNORMAL HIGH (ref <150)

## 2010-09-05 LAB — CARDIAC PANEL(CRET KIN+CKTOT+MB+TROPI)
CK, MB: 131.7 ng/mL — ABNORMAL HIGH (ref 0.3–4.0)
Total CK: 1596 U/L — ABNORMAL HIGH (ref 7–232)
Total CK: 764 U/L — ABNORMAL HIGH (ref 7–232)

## 2010-09-05 LAB — HEPARIN LEVEL (UNFRACTIONATED): Heparin Unfractionated: 0.5 IU/mL (ref 0.30–0.70)

## 2010-10-10 NOTE — Cardiovascular Report (Signed)
Connor Walker, Connor Walker NO.:  [id number]   MEDICAL RECORD NO.:  [id number]          PATIENT TYPE:  INP   LOCATION:  2920                         FACILITY:  [facility]   PHYSICIAN:  Verne Carrow, MDDATE OF BIRTH:  April 17, 1946   DATE OF PROCEDURE:  10/27/2008  DATE OF DISCHARGE:                            CARDIAC CATHETERIZATION   PRIMARY CARDIOLOGIST:  Gerrit Friends. Dietrich Pates, MD, [facility]   PROCEDURES PERFORMED:  1. Percutaneous coronary intervention with placement of a Xience drug-      eluting stent in the distal circumflex artery and placement of a      Xience drug-eluting stent in the mid circumflex coronary artery.  2. Placement of an Angio-Seal femoral artery closure device in the      left femoral artery.   INDICATIONS:  Non-ST-elevation myocardial infarction.   OPERATOR:  Verne Carrow, MD   PROCEDURE IN DETAIL:  The patient was brought to the main cardiac  catheterization laboratory after signing informed consent for the  procedure.  The left groin was prepped and draped in a sterile fashion.  Lidocaine 1% was used for local anesthesia.  A 6-French sheath was  inserted into the left femoral artery without difficulty.  An XB 3.5  guiding catheter was used to selectively engage the left main coronary  artery.  4500 units of intravenous heparin was given.  The patient was  continued on his Integrilin drip.  When the ACT was greater than 200 I  passed a Cougar intracoronary wire down the length of the circumflex  artery into the distal circumflex vessel.  At this point a 2.5 x 15-mm  balloon was passed down to the distal lesion and inflated to 8  atmospheres and 2 overlapping segments.  The balloon was then pulled  back to the proximal to midportion and there was area of severe stenosis  and was inflated to 10 atmospheres.  This balloon was removed and a 2.5  x 28 mm Xience drug-eluting stent was deployed in the distal circumflex.  A 2.75 x 20-mm  noncompliant balloon was inflated twice inside the distal  stented segment with peak inflation of 10 atmospheres.  This gave [place] a  2.70 mm diameter.  At this point, we turned our attention to the mid  circumflex lesion.  A 3.0 x 15 mm Xience drug-eluting stent was deployed  in the area of tighter stenosis.  A 3.25 x 12-mm noncompliant balloon  was inflated to 16 atmospheres within the stented segment.  This gave [place]  a diameter of 3.34 mm.  The stenosis in the distal segment was taken  from 90% to 0%.  The stenosis in the mid segment was taken from 90% to  0%.  There was excellent flow down the vessel following the  intervention.  The patient tolerated the procedure well.  An Angio-Seal  femoral artery closure device was placed in left femoral artery.  The  patient was taken to the holding area in stable condition.   IMPRESSION:  1. Successful percutaneous coronary invention with placement of a      Xience drug-eluting stent in the  mid circumflex coronary artery.  2. Successful percutaneous coronary intervention with placement of a      Xience drug-eluting stent in the distal circumflex coronary artery.   RECOMMENDATIONS:  The patient should be continued on aspirin and Plavix  for at least 1 year.  We will continue all of his other medications  written.  We will stop the Integrilin after 12 hours.      Verne Carrow, MD  Electronically Signed     CM/MEDQ  D:  10/27/2008  T:  10/27/2008  Job:  [id number]

## 2010-10-10 NOTE — Discharge Summary (Signed)
NAMEKEELON, ZURN NO.:  [id number]   MEDICAL RECORD NO.:  [id number]          PATIENT TYPE:  INP   LOCATION:  2920                         FACILITY:  [facility]   PHYSICIAN:  Verne Carrow, MDDATE OF BIRTH:  02-11-1946   DATE OF ADMISSION:  10/24/2008  DATE OF DISCHARGE:  10/28/2008                               DISCHARGE SUMMARY   PRIMARY CARDIOLOGIST:  Verne Carrow, MD   DISCHARGE DIAGNOSIS:  Coronary artery disease, S/P non-ST-elevation  myocardial infarction, S/P cardiac catheterization with percutaneous  coronary intervention x2 with drug-eluting stent placement to  proximal/mid right coronary artery as well as proximal/distal  circumflex.   SECONDARY DIAGNOSES:  1. Toe fungus.  2. Cataract surgery.   ALLERGIES:  NKDA.   PROCEDURES:  1. EKG showing sinus rhythm at a rate of 61 with minimal T-wave      inversion in leads V2-V4 upsloping in V3 and V4.  No other acute ST      - T-wave changes, no significant Q-waves, normal axis, no evidence      of hypertrophy, PR 128, QRS 100, QTC 435 and no prior EKG for      comparison.  2. Chest x-ray performed Oct 24, 2008 showing no acute cardiopulmonary      process.  3. EKG performed Oct 25, 2008 sinus bradycardia at a rate of 56, no ST      depression noted, no other changes from prior tracing.  4. EKG performed October 26, 2008, showing normal sinus rhythm at rate of      61, no significant changes from prior tracing.  5. Cardiac catheterization performed October 26, 2008 showing:      a.     Triple-vessel coronary artery disease.      b.     Normal left ventricular systolic function with slight wall       motion abnormality.      c.     Successful percutaneous coronary intervention of the       proximal and mid RCA with placement of two (Xience) drug-eluting       stents.  6.  EKG performed October 27, 2008 showing sinus rhythm at a       rate of 62, T-wave flattening/minimal inversion in leads V6, I  and       aVL.  Otherwise, no significant changes from prior tracing.  6. Cardiac catheterization performed October 27, 2008 involving successful      PCI using Xience DES x2 in the mid and distal circumflex coronary      artery.  7. EKG performed October 28, 2008 showing normal sinus rhythm at a rate of      67.  Same T-wave abnormalities noted from October 27, 2008, otherwise      no changes from prior tracing.  8. Two-D echocardiogram performed October 28, 2008 showing mild LVH, LVEF      in range of 55-60%, possible hypokinesis of the basal  - mid      inferior myocardium, parameters consistent with abnormal LV      relaxation (grade 1 diastolic dysfunction).  HISTORY OF PRESENT ILLNESS:  Mr. Connor Walker is a 65 year old Caucasian male  with no significant past medical history presenting with chest pain.  Pain started approximately 4:30 p.m. on Oct 24, 2008 with a reoccurrence  at 6:30 p.m. that was constant.  He denies any previous episodes.  Pain  was substernal, radiated to his bilateral neck with some jaw pain and  associated shortness of breath with diaphoresis.  The patient denies  nausea and palpitations, as well as any history of orthopnea, PND, or  edema.  The patient reports taking an aspirin, and going to the ED where  he was given more aspirin, 300 mg of Plavix, and placed on heparin drip.  Pain decreased with nitroglycerin and nitroglycerin drip from an 8/10 to  a 5/10 and was completely resolved after receiving 4 mg of IV morphine.   HOSPITAL COURSE:  The patient was admitted and underwent the procedures  as described above.  He tolerated them well without any significant  complications.   The patient noted to have elevated cardiac enzymes on Oct 25, 2008 and  as he was hemodynamically stable and asymptomatic was scheduled for  cardiac catheterization but was scheduled for non-emergent cardiac  catheterization on October 26, 2008, (see results above).  In order to err  on the side of  caution regarding his renal function, the patient had  second cardiac cath/PCI scheduled for October 27, 2008 (see results above).  The patient assessed by Dr. Verne Carrow on October 28, 2008 and  deemed stable for discharge after ambulating without any symptoms with  cardiac rehab, phase one.   On October 26, 2008 the patient enrolled in TRACER clinical trial.  The  risks and benefits of steady participation were explained to the patient  prior to him signing the informed consent form.  No study specific to  procedures were completed prior to the patient receiving a copy of his  signed informed consent form.   The patient will continue on medications initiated in the hospital  including full strength aspirin, Plavix 75 mg (for at least 1 year),  simvastatin 40 mg p.o. daily, and metoprolol 12.5 mg p.o. b.i.d.  The  patient will also be given nitroglycerin to be used p.r.n. for chest  pain and a followup appointment with Dr. Verne Carrow on November 08, 2008 at 10:30 a.m.  At the time of discharge, the patient received  his new medication list, prescriptions, followup instructions, and post  cath instructions.  He will have all questions or concerns addressed  prior to discharge.   DISCHARGE LABORATORY DATA:  WBC 7.1, HGB 13.0, HCT 38.0, PLT count 129,  sodium 140, potassium 3.8, chloride 104, CO2 27, BUN 7, creatinine 0.8,  glucose 104.  Cardiac enzymes down trending last troponin 9.6 down from  peak of 11.   FOLLOWUP PLANS AND APPOINTMENTS:  Please see hospital course.   DISCHARGE MEDICATIONS:  1. Terbinafine 250 mg p.o. daily.  2. TRACER study drug.  3. Enteric-coated aspirin 325 mg p.o. daily.  4. Plavix 75 mg p.o. daily.  5. Simvastatin 40 mg p.o. daily.  6. Metoprolol 12.5 mg p.o. b.i.d.  7. Nitroglycerin 0.4 mg sublingual p.r.n. for chest pain.  8. Tylenol is to replace diclofenac for p.r.n. pain use other than      chest pain.   Duration of discharge encounter  including physician time was 45 minutes.      Connor Walker, [facility]      Verne Carrow, MD  Electronically Signed    MS/MEDQ  D:  10/28/2008  T:  10/29/2008  Job:  [id number]

## 2010-10-10 NOTE — Cardiovascular Report (Signed)
NAMEJULEON, Connor Walker NO.:  [id number]   MEDICAL RECORD NO.:  [id number]           PATIENT TYPE:   LOCATION:                                 FACILITY:   PHYSICIAN:  Verne Carrow, MDDATE OF BIRTH:  [birth year]   DATE OF PROCEDURE:  10/26/2008  DATE OF DISCHARGE:                            CARDIAC CATHETERIZATION   PROCEDURE PERFORMED:  1. Left heart catheterization  2. Selective coronary angiography.  3. Left ventricular angiogram.  4. Percutaneous coronary intervention with placement of 2 drug-eluting      stents in the mid and proximal right coronary artery.  5. Placement of an Angio-Seal femoral artery closure device.   OPERATOR:  Verne Carrow, MD   INDICATIONS:  Non-ST-elevation myocardial infarction.   PROCEDURE IN DETAIL:  The patient was brought in to the main cardiac  catheterization laboratory after signing informed consent for the  procedure.  The right groin was prepped and draped in sterile fashion.  Lidocaine 1% was used for local anesthesia.  A 5-French sheath was  inserted into the right femoral artery without difficulty.  Standard  diagnostic catheters were used to perform selective coronary  angiography.  A pigtail catheter was used to perform a left ventricular  angiogram.  The patient tolerated this portion of the procedure well.   We elected to proceed to intervention of the right coronary artery which  was felt to be the culprit vessel for his non-ST-elevation myocardial  infarction.  The patient had been on Integrilin overnight and had been  loaded with Plavix previously.  We gave him a bolus of intravenous  heparin and once the ACT was greater than 200, I selectively engaged the  right coronary artery with a JR-4 guiding catheter with side holes.  A  cougar intracoronary wire was passed down the right coronary artery  without difficulty.  A 2.5 x 20-mm balloon was used to predilate the  lesion in the mid right coronary  artery and in the proximal right  coronary artery.  Following balloon inflation, there were some  difficulty with engagement of the guide.  During manipulation of the  guiding catheter, the coronary wire drew back into the aorta.  At this  point, I re-engaged with the JR-4 guiding catheter and passed the cougar  wire down to the midportion of the right coronary artery.  There was  some difficulty passing this area in the midportion of the vessel.  I  then used a BMW wire with a slightly different band to get down into the  distal right coronary artery.  At this point, the cougar wire was  removed.  The 2.5 x 20-mm balloon was then reinserted and it was  inflated one more time in the proximal portion of the vessel.  There was  excellent flow down the vessel following this balloon inflation.  A 2.5  x 28-mm Xience drug-eluting stent was then deployed in the midportion of  the vessel.  An overlapping 2.5 x 28 mm Xience drug-eluting stent was  then deployed in the proximal portion of the vessel.  A 2.75 x 21-mm  noncompliant balloon was then inflated to 16 atmospheres in 4  overlapping inflations inside the stented segment.  There was an  excellent angiographic result with TIMI III flow down the vessel both  before and after the procedure.  The stenosis was taken to 0% both in  the mid and proximal portion of the vessel.  There was no other  significant disease noted in this vessel.   The patient tolerated the procedure well and was taken to the holding  area.  An Angio-Seal femoral artery closure device was used to seal the  right femoral artery.   ANGIOGRAPHIC FINDINGS:  1. The left main coronary artery had no significant disease.  2. The left anterior descending is a large vessel that courses to the      apex.  Just beyond a large septal perforator and a moderate-sized      diagonal branch, there is a long tubular area of 50-60% stenosis.      This does not appear to be flow-limiting  angiographically.  The      first diagonal branch has 60-70% disease starting at the ostium      extending down to the midportion of the vessel.  Once again, this      is a moderate-sized vessel.  The distal LAD has minimal plaque      disease.  3. The circumflex artery is a large vessel that has a proximal 90%      stenosis and a distal 90-95% stenosis.  The first obtuse marginal      is a small to moderate-sized vessel that has diffuse 70% disease      extending from the ostium down into the midportion of the vessel.  4. The right coronary artery is a dominant vessel that has a severely      diseased portion, extending from the proximal down into the      midportion.  The two tightest areas are 95% stenosis in the      midportion of the vessel and a 90-95% stenosis in the proximal      portion of the vessel.  This area was hazy appearing.  5. Left ventricular angiogram was performed in the RAO projection.  It      shows normal left ventricular systolic function with ejection      fraction of 55%.  There was mild hypokinesis of the anterolateral      wall.   HEMODYNAMIC DATA:  Central aortic pressure 105/60, left ventricular  pressure 118/4, left ventricular end-diastolic pressure 13.   IMPRESSION:  1. Triple-vessel coronary artery disease.  2. Normal left ventricular systolic function with a slight wall motion      abnormality.  3. Successful percutaneous coronary intervention of the proximal and      mid right coronary artery with placement of 2 drug-eluting stents.   RECOMMENDATIONS:  I would currently recommend continuing aspirin,  Plavix, and the Integrilin drip.  We will bring him back to the cath lab  to perform PCI of the circumflex artery.      Verne Carrow, MD  Electronically Signed     CM/MEDQ  D:  10/26/2008  T:  10/26/2008  Job:  [id number]

## 2010-10-10 NOTE — H&P (Signed)
[name], Connor Walker.:  [id number]   MEDICAL RECORD Walker.:  [id number]          PATIENT TYPE:  INP   LOCATION:  1829                         FACILITY:  [facility]   PHYSICIAN:  Unice Cobble, MD     DATE OF BIRTH:  11-23-1945   DATE OF ADMISSION:  10/24/2008  DATE OF DISCHARGE:                              HISTORY & PHYSICAL   CHIEF COMPLAINTS:  Chest pain.   HISTORY OF PRESENT ILLNESS:  This is a 65 year old white male with Walker  significant past medical history who presents with chest pain.  The  patient had chest pain onset at 4:30 p.m. briefly today and recurrence  at 6:30 p.m. that was constant.  He has had Walker prior episodes.  The pain  was substernal and radiated to his bilateral neck with some jaw pain and  was associated with shortness of breath and diaphoresis.  Walker nausea or  palpitations.  Walker history of orthopnea, PND, or edema.  He took an  aspirin and went to the emergency department where he was given more  aspirin, 300 mg of Plavix, and heparin drip.  His pain was decreased  with nitroglycerin and a nitroglycerin drip to a 5-6/10 from an 8/10 and  then was relief completed with 4 mg of morphine later.   PAST MEDICAL HISTORY:  1. Toe fungus.  2. Cataract surgery.   ALLERGIES:  Walker KNOWN DRUG ALLERGIES.   MEDICATIONS:  1. Diclofenac 75 mg b.i.d.  2. Terbinafine 250 mg daily.   SOCIAL HISTORY:  Lives in [place] with his wife.  He is working at  [employer]. Walker significant tobacco history.  He drinks on weekends only.  Walker  drugs.   FAMILY HISTORY:  His mother died of cancer.  His father died of  complications from carotid endarterectomy.   REVIEW OF SYSTEMS:  Complete review of systems done and found to be  otherwise negative except as stated in the HPI.   PHYSICAL EXAMINATION:  VITAL SIGNS:  He is afebrile with a pulse of 60,  respiratory rate is 18, blood pressure is 154/91 coming down to 138/86  with nitro administration.  O2 sat's 100% on 2  liters.  GENERAL:  He is tall and thin.  Walker acute distress.  HEENT: Shows PERRLA, EO MI, MMM. Oropharynx without erythema or  exudates.  NECK:  Supple without lymphadenopathy, thyromegaly, bruits or jugular  venous distention.  HEART:  Has a regular rate and rhythm with normal S1-S2 without murmurs,  gallops or rubs.  PMI is normal.  Pulses 2+ and equal bilaterally  without bruits.  LUNGS:  Clear to auscultation bilaterally.  SKIN:  Walker rashes or lesions.  ABDOMEN:  Soft and nontender with normal bowel sounds.  Walker rebound or  guarding.  EXTREMITIES:  Warm and well-perfused without cyanosis, clubbing or  edema.  MUSCULOSKELETAL:  Shows Walker joint deformity or effusions.  Walker spine or  CVA tenderness.  Walker limb laxity or hyperreflexia.  NEUROLOGICAL:  He is alert and oriented x3 with cranial nerves II-XII  grossly intact.  Strength is 5/5 all extremities and  axial groups.  Normal sensation throughout.   LABORATORY/RADIOLOGY:  His chest x-ray shows Walker acute cardiopulmonary  disease.   EKG shows rate 61 and normal sinus rhythm.  He has 1 mm of ST depression  in V2-V4 with T-wave inversions in AVL.   Labs show white count of 6.9 with a hemoglobin of 15.  Potassium is 3.9  with a creatinine of 0.9.  CK-MB is 3.2 with a troponin of less than  0.05.   ASSESSMENT/PLAN:  This is a 65 year old white male with Walker significant  past medical history who presents with a story and ECG concerning for  unstable angina/NSTEMI. I will rule him out overnight with serial  troponin's and EKG's.  He will be treated with aspirin, Plavix, heparin  drip, Statin, and beta blocker (HR & BP permitting).  He will likely  need cardiac catheterization in the morning.  Lipid panel will be  checked in the morning.  Repeat EKG's will be as needed to assess for  change his ST depression.      Unice Cobble, MD  Electronically Signed     ACJ/MEDQ  D:  10/24/2008  T:  10/24/2008  Job:  [id number]

## 2010-10-19 ENCOUNTER — Encounter: Payer: Self-pay | Admitting: Cardiovascular Disease

## 2010-10-25 ENCOUNTER — Ambulatory Visit (INDEPENDENT_AMBULATORY_CARE_PROVIDER_SITE_OTHER): Payer: 59 | Admitting: Cardiovascular Disease

## 2010-10-25 ENCOUNTER — Encounter: Payer: Self-pay | Admitting: Cardiovascular Disease

## 2010-10-25 VITALS — BP 110/70 | HR 59 | Resp 17 | Ht 74.0 in | Wt 185.8 lb

## 2010-10-25 DIAGNOSIS — I2510 Atherosclerotic heart disease of native coronary artery without angina pectoris: Secondary | ICD-10-CM

## 2010-10-25 MED ORDER — NITROGLYCERIN 0.4 MG SL SUBL: 0.4000 mg | SUBLINGUAL_TABLET | SUBLINGUAL | Status: DC | PRN

## 2010-10-25 NOTE — Progress Notes (Signed)
History of Present Illness:65 yo WM with CAD here today for follow up. He was admitted from Oct 24, 2008 until October 28, 2008 with a NSTEMI. I placed drug eluting stents in the proximal and mid RCA and in the mid Circumflex. He is here today for f/u. No chest pain, SOB, palpitations, dizziness, near syncope or syncope. He has been tolerating his medications well. 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   Most recent lipids in our office January 2011 with Total chol: 152, HDL: 66, LDL: 71. He had lipids checked at work this spring and was told that his lipids are "excellent".   Past Medical History  Diagnosis Date  . Coronary artery disease June 2010    CAD native vessel with drug eluting stents in RCA and Circumflex  . Onycholysis   . Neurogenic bladder     Past Surgical History  Procedure Date  . Cataract extraction     cataract surgery bilateral    Current Outpatient Prescriptions  Medication Sig Dispense Refill  . aspirin 81 MG EC tablet Take 81 mg by mouth daily.        . ciprofloxacin (CIPRO) 500 MG tablet Take 500 mg by mouth 2 (two) times daily.        . clopidogrel (PLAVIX) 75 MG tablet Take 75 mg by mouth daily.        . metoprolol tartrate (LOPRESSOR) 25 MG tablet Take 25 mg by mouth 2 (two) times daily. 1/2 tab, two times day       . nitroGLYCERIN (NITROSTAT) 0.4 MG SL tablet Place 0.4 mg under the tongue every 5 (five) minutes as needed. Up to 3 doses       . simvastatin (ZOCOR) 40 MG tablet Take 40 mg by mouth at bedtime.        . zoster vaccine live, PF, (ZOSTAVAX) 04540 UNT/0.65ML injection Inject 0.65 mLs into the skin once. Administer vaccine x 1         Allergies not on file  History   Social History  . Marital Status: Married    Spouse Name: N/A    Number of Children: N/A  . Years of Education: N/A   Occupational History  . Not on file.   Social History Main Topics  . Smoking status: Never Smoker   . Smokeless tobacco: Never Used  .  Alcohol Use: Yes     on weekends  . Drug Use: No  . Sexually Active:    Other Topics Concern  . Not on file   Social History Narrative   Full [employer]. Married, 4 children (Wife - Deborrah, works at [employer])    Family History  Problem Relation Age of Onset  . Coronary artery disease Neg Hx   . Hypertension Sister   . Leukemia Brother     Review of Systems:  As stated in the HPI and otherwise negative.   BP 110/70  Pulse 59  Resp 17  Ht 6\' 2"  (1.88 m)  Wt 185 lb 12.8 oz (84.278 kg)  BMI 23.86 kg/m2  Physical Examination: General: Well developed, well nourished, NAD HEENT: OP clear, mucus membranes moist SKIN: warm, dry. No rashes. Neuro: No focal deficits Musculoskeletal: Muscle strength 5/5 all ext Psychiatric: Mood and affect normal Neck: No JVD, no carotid bruits, no thyromegaly, no lymphadenopathy. Lungs:Clear bilaterally, no wheezes, rhonci, crackles Cardiovascular: Regular rate and rhythm. No murmurs, gallops or rubs. Abdomen:Soft. Bowel sounds present. Non-tender.  Extremities: No lower extremity edema. Pulses are 2 + in the bilateral DP/PT.  ZOX:WRUEA brady, rate 59 bpm.

## 2010-10-25 NOTE — Patient Instructions (Signed)
Your physician recommends that you schedule a follow-up appointment in: 1 year with Dr. McAlhany   

## 2010-10-25 NOTE — Assessment & Plan Note (Signed)
Stable. Will continue ASA/Plavix for now. Continue statin, beta blocker. Refill NTG SL.

## 2010-10-31 ENCOUNTER — Other Ambulatory Visit: Payer: Self-pay | Admitting: Cardiovascular Disease

## 2010-10-31 NOTE — Telephone Encounter (Signed)
Pt needs plavix (GENERIC) simvastatin, metoprolol [facility] [phone]

## 2010-10-31 NOTE — Telephone Encounter (Signed)
LMOM to verify dose on metoprolol

## 2010-10-31 NOTE — Telephone Encounter (Signed)
Per pt call pt takes 25 mg total 1/2 tab two times a day

## 2010-11-01 MED ORDER — CLOPIDOGREL BISULFATE 75 MG PO TABS: 75.0000 mg | ORAL_TABLET | Freq: Every day | ORAL | Status: DC

## 2010-11-01 MED ORDER — SIMVASTATIN 40 MG PO TABS: 40.0000 mg | ORAL_TABLET | Freq: Every day | ORAL | Status: DC

## 2010-11-01 MED ORDER — METOPROLOL TARTRATE 25 MG PO TABS: 25.0000 mg | ORAL_TABLET | Freq: Two times a day (BID) | ORAL | Status: DC

## 2011-06-12 ENCOUNTER — Other Ambulatory Visit: Payer: Self-pay | Admitting: Cardiovascular Disease

## 2011-06-15 ENCOUNTER — Telehealth: Payer: Self-pay | Admitting: Cardiovascular Disease

## 2011-06-15 NOTE — Telephone Encounter (Signed)
Patient takes Plavix so I advised will need to check with Dr Clifton James when he is in office next week.  Will forward to MD and his nurse Dennie Bible

## 2011-06-15 NOTE — Telephone Encounter (Signed)
New msg Pt has rx for tagament He wants to know if ok to take with his other meds

## 2011-06-18 ENCOUNTER — Telehealth: Payer: Self-pay | Admitting: Cardiovascular Disease

## 2011-06-18 NOTE — Telephone Encounter (Signed)
Left message with information from Dr. Clifton James on pt's identified voicemail. Left message to call back if any questions.

## 2011-06-18 NOTE — Telephone Encounter (Signed)
New problem Pt is having his teeth cleaned and he wants to know should he take antibiotics

## 2011-06-18 NOTE — Telephone Encounter (Signed)
Can we let him know that this should be ok? Thanks, chris

## 2011-06-18 NOTE — Telephone Encounter (Signed)
Spoke with pt and told him he does not need antibiotics prior to dental cleaning.

## 2011-09-27 IMAGING — CT CT ABDOMEN W/ CM
2 of 4 series · 15 of 46 positions shown, 17 images · IV contrast (APPLIED)
Comparison: None available

CT ABDOMEN

CLINICAL DATA: Palpable abdominal/pelvic mass.  Unexplained weight
loss.  Family history of leukemia.

CT ABDOMEN AND PELVIS WITH CONTRAST
TECHNIQUE: Multidetector CT imaging of the abdomen and pelvis was
performed using the standard protocol following bolus
administration of intravenous contrast.
Contrast: 100 ml Rmnipaque-1RR and oral contrast

[Series 2: abd/pelvis 5.0 b31f · axial · 0.69mm/px · z∈[-614,-199]mm · 12 of 91 slices shown, 14 images]
[im 4/91  soft-tissue]
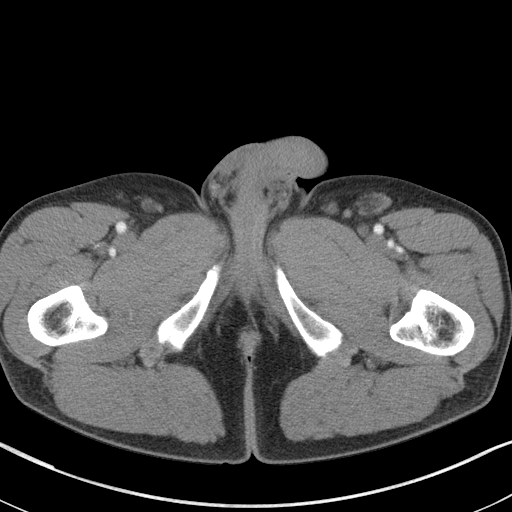
[im 4/91  bone]
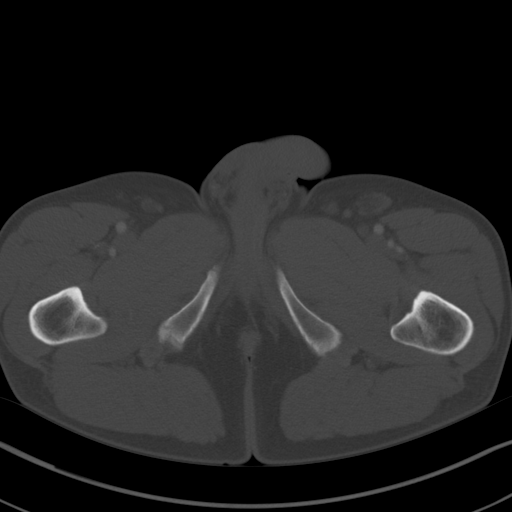
[im 12/91  soft-tissue]
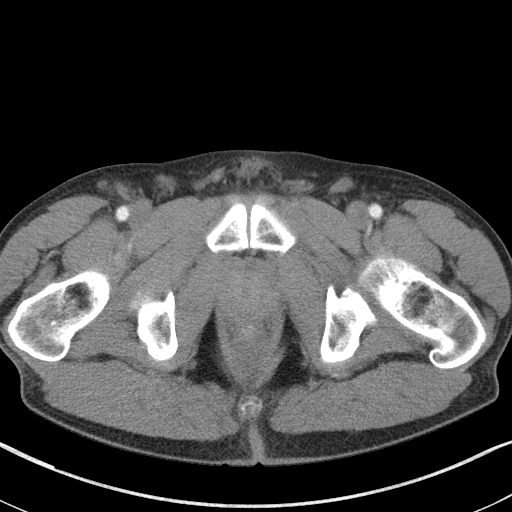
[im 20/91  soft-tissue]
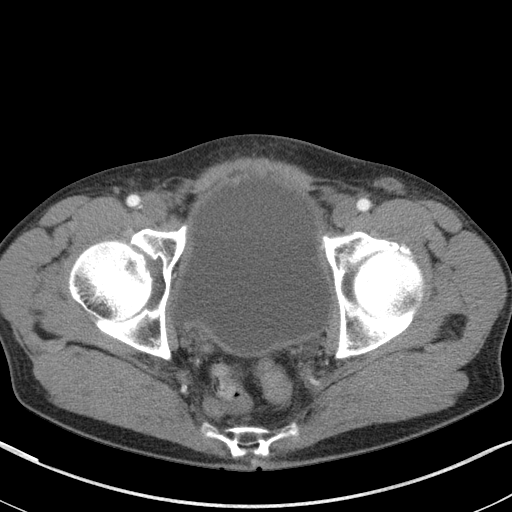
[im 28/91  soft-tissue]
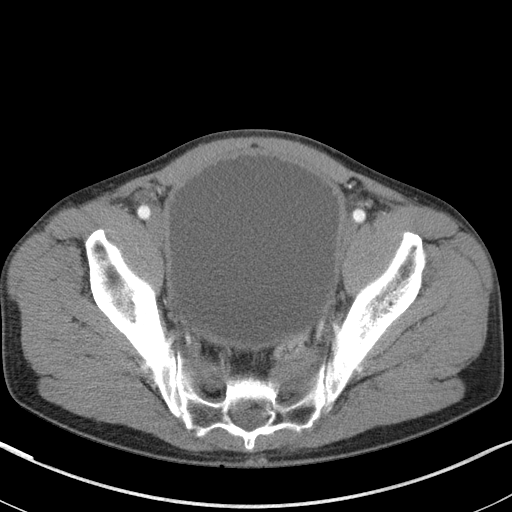
[im 36/91  soft-tissue]
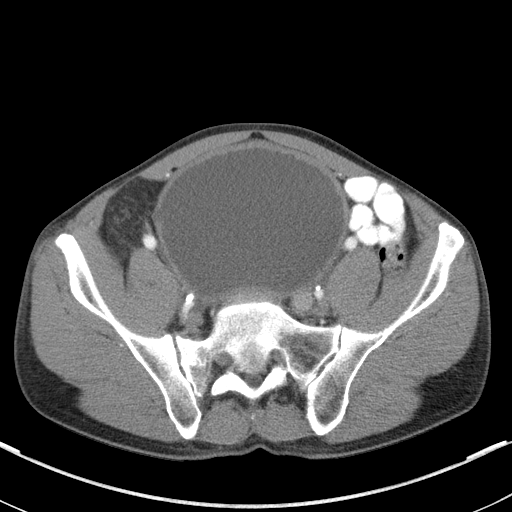
[im 44/91  soft-tissue]
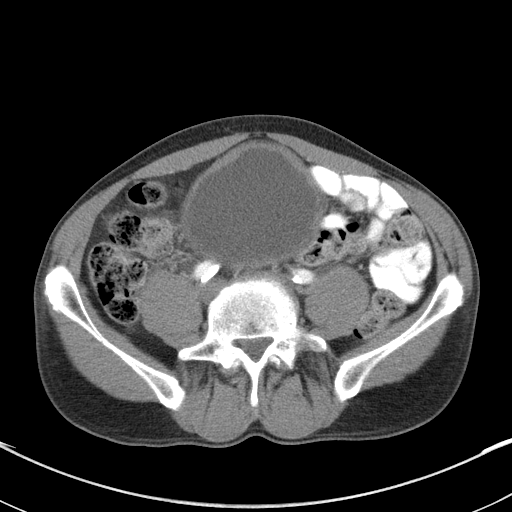
[im 47/91  soft-tissue]
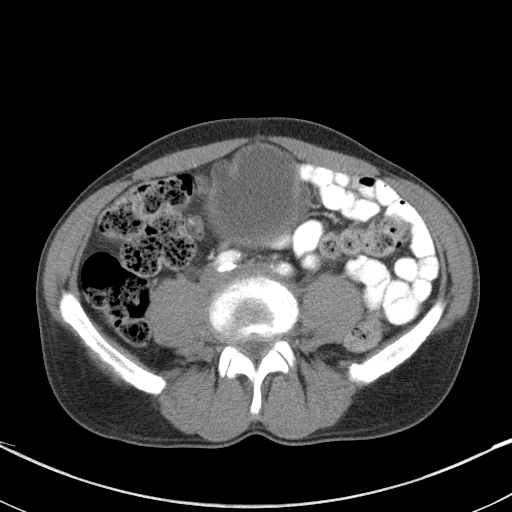
[im 55/91  soft-tissue]
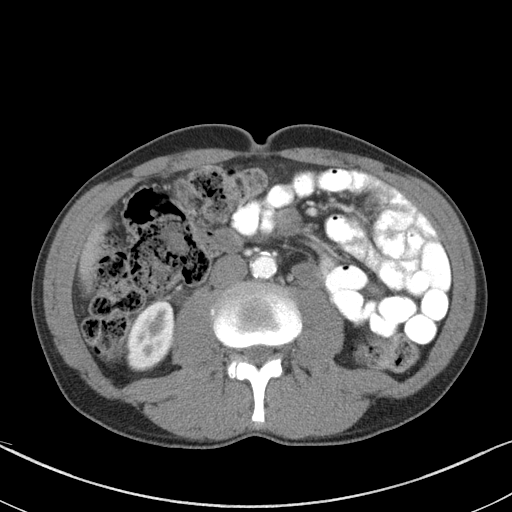
[im 63/91  soft-tissue]
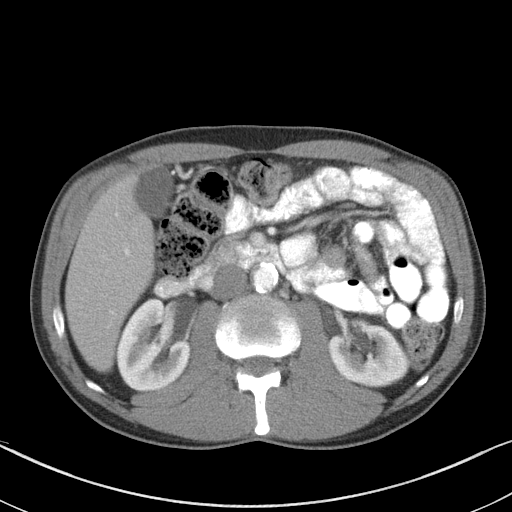
[im 63/91  bone]
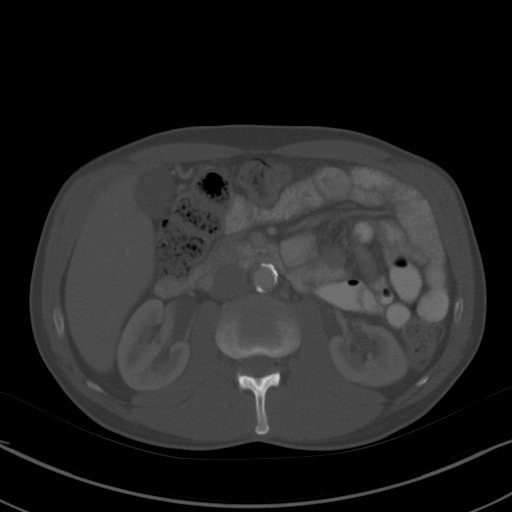
[im 71/91  soft-tissue]
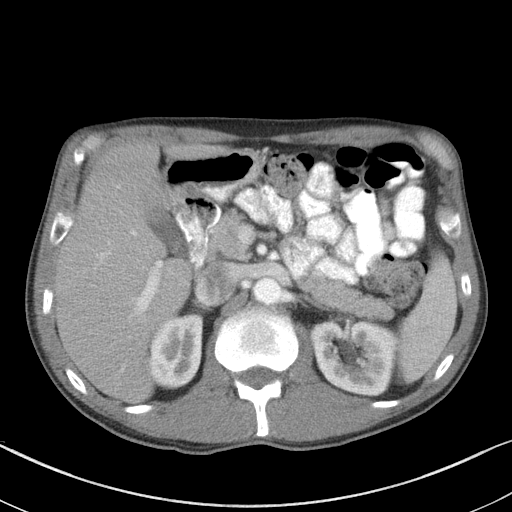
[im 79/91  soft-tissue]
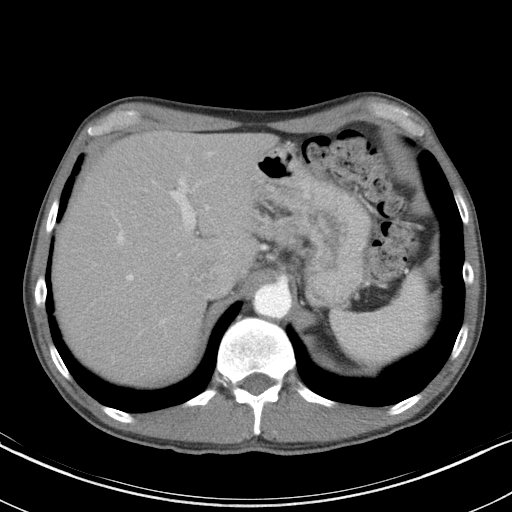
[im 87/91  soft-tissue]
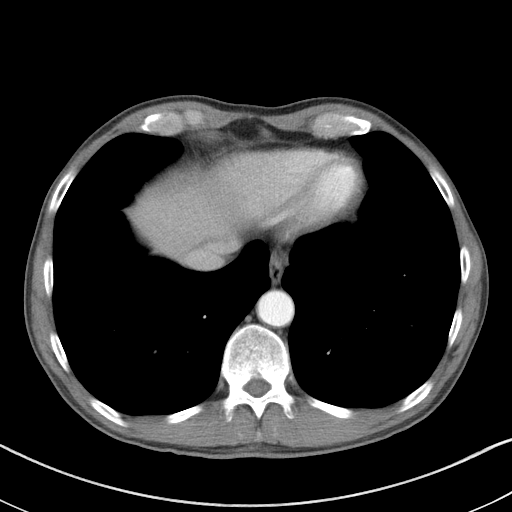

[Series 5: abd/pelvis 3.0 coronal · coronal · 0.71mm/px · 3 of 77 slices shown]
[im 26/77  soft-tissue]
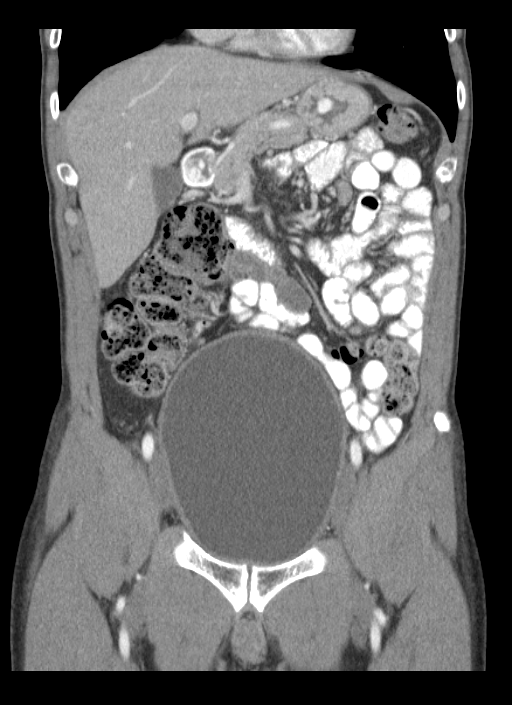
[im 34/77  soft-tissue]
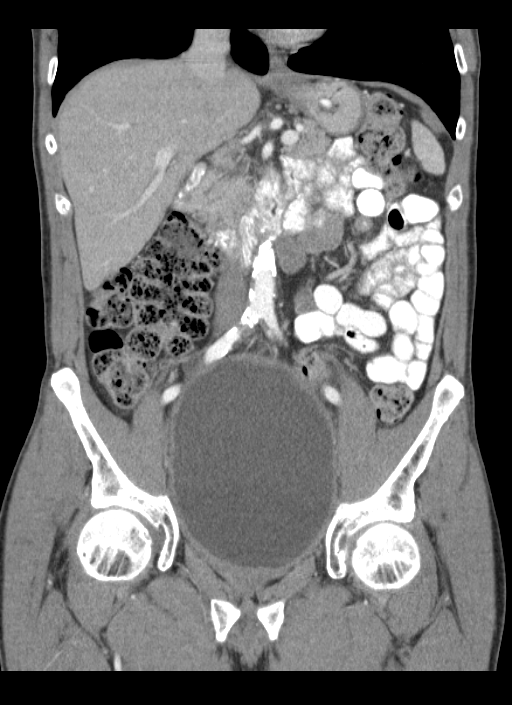
[im 43/77  soft-tissue]
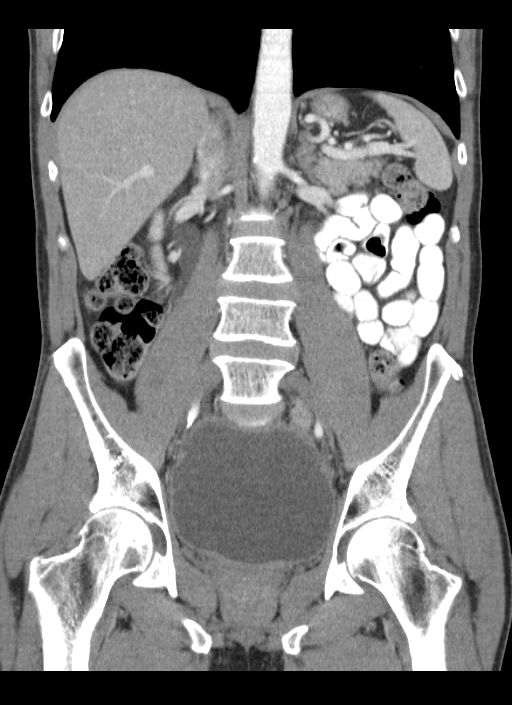

[15 of 46 positions shown; findings below may reference images not displayed]

FINDINGS: The abdominal parenchymal organs are normal in
appearance.  Gallbladder is unremarkable, there is no evidence of
hydronephrosis.  There is no evidence of abdominal soft tissue
masses or lymphadenopathy.  No abnormal fluid collections or
inflammatory process identified.

There is no evidence of abdominal wall soft tissue mass or hernia.
Large amount of stool is seen in the colon particularly in a
redundant ascending colon.
IMPRESSION: 1.  No evidence of abdominal soft tissue masses or other acute
findings.
2.  Large amount of colonic stool noted, particularly in a
redundant ascending colon.

CT PELVIS
FINDINGS: The urinary bladder is dilated and extends into the
lower abdomen.  Diffuse bladder wall thickening is seen with
several diverticuli near the bladder dome.  This is consistent with
chronic bladder outlet obstruction or neurogenic bladder.  The
prostate gland is not significantly enlarged.  No soft tissue
masses or lymphadenopathy identified in the pelvis.

There is no evidence of inflammatory process or abnormal fluid
collections in the pelvis.  No hernia identified.  No suspicious
bone lesions are identified.  Incidental note is made of a large
Tarlov cyst in the right sacrum, which is of no clinical
significance.
IMPRESSION: 1.  Dilated urinary bladder with mild diffuse wall thickening and
small bladder diverticuli.  This is consistent with chronic bladder
outlet obstruction or neurogenic bladder.
2.  No evidence of pelvic soft tissue mass or other acute
findings.

## 2011-10-23 ENCOUNTER — Ambulatory Visit (INDEPENDENT_AMBULATORY_CARE_PROVIDER_SITE_OTHER): Payer: BC Managed Care – PPO | Admitting: Cardiovascular Disease

## 2011-10-23 ENCOUNTER — Encounter: Payer: Self-pay | Admitting: Cardiovascular Disease

## 2011-10-23 VITALS — BP 121/77 | HR 57 | Ht 74.0 in | Wt 181.0 lb

## 2011-10-23 DIAGNOSIS — I2510 Atherosclerotic heart disease of native coronary artery without angina pectoris: Secondary | ICD-10-CM

## 2011-10-23 NOTE — Progress Notes (Signed)
   History of Present Illness: 66 yo WM with CAD here today for follow up. He was admitted from Oct 24, 2008 until October 28, 2008 with a NSTEMI. I placed drug eluting stents in the proximal and mid RCA and in the mid Circumflex. He is here today for f/u. No chest pain, SOB, palpitations, dizziness, near syncope or syncope. He has been tolerating his medications well. He does not exercise. He walks the dog twice per day. He did yard work yesterday with no problems.   Primary Care Physician: Dr. Artist Pais  Last Lipid Profile:  Past Medical History  Diagnosis Date  . Coronary artery disease June 2010    CAD native vessel with drug eluting stents in RCA and Circumflex  . Onycholysis   . Neurogenic bladder     Past Surgical History  Procedure Date  . Cataract extraction     cataract surgery bilateral    Current Outpatient Prescriptions  Medication Sig Dispense Refill  . aspirin 81 MG EC tablet Take 81 mg by mouth daily.        . clopidogrel (PLAVIX) 75 MG tablet TAKE 1 TABLET (75 MG TOTAL) BY MOUTH DAILY.  30 tablet  6  . metoprolol tartrate (LOPRESSOR) 25 MG tablet Take 1 tablet (25 mg total) by mouth 2 (two) times daily. 1/2 tab, two times day  60 tablet  6  . nitroGLYCERIN (NITROSTAT) 0.4 MG SL tablet Place 1 tablet (0.4 mg total) under the tongue every 5 (five) minutes as needed. Up to 3 doses  25 tablet  3  . simvastatin (ZOCOR) 40 MG tablet TAKE 1 TABLET (40 MG TOTAL) BY MOUTH AT BEDTIME.  30 tablet  6    No Known Allergies  History   Social History  . Marital Status: Married    Spouse Name: N/A    Number of Children: N/A  . Years of Education: N/A   Occupational History  . Not on file.   Social History Main Topics  . Smoking status: Former Smoker    Quit date: 10/24/1985  . Smokeless tobacco: Never Used  . Alcohol Use: Yes     on weekends  . Drug Use: No  . Sexually Active: Not on file   Other Topics Concern  . Not on file   Social History Narrative   Full tim[employer]. Married, 4 children (Wife - Deborrah, works at [employer])    Family History  Problem Relation Age of Onset  . Coronary artery disease Neg Hx   . Hypertension Sister   . Leukemia Brother     Review of Systems:  As stated in the HPI and otherwise negative.   BP 121/77  Pulse 57  Ht 6\' 2"  (1.88 m)  Wt 181 lb (82.101 kg)  BMI 23.24 kg/m2  Physical Examination: General: Well developed, well nourished, NAD HEENT: OP clear, mucus membranes moist SKIN: warm, dry. No rashes. Neuro: No focal deficits Musculoskeletal: Muscle strength 5/5 all ext Psychiatric: Mood and affect normal Neck: No JVD, no carotid bruits, no thyromegaly, no lymphadenopathy. Lungs:Clear bilaterally, no wheezes, rhonci, crackles Cardiovascular: Regular rate and rhythm. No murmurs, gallops or rubs. Abdomen:Soft. Bowel sounds present. Non-tender.  Extremities: No lower extremity edema. Pulses are 2 + in the bilateral DP/PT.  EKG:

## 2011-10-23 NOTE — Assessment & Plan Note (Signed)
Stable. He is on good medical therapy. BP is well controlled. He has had recent lipids at work and will bring by. Well controlled per pt. I will see him in one year.

## 2011-10-23 NOTE — Patient Instructions (Signed)
Your physician wants you to follow-up in:  12 months.  You will receive a reminder letter in the mail two months in advance. If you don't receive a letter, please call our office to schedule the follow-up appointment.   

## 2011-11-02 ENCOUNTER — Other Ambulatory Visit: Payer: Self-pay | Admitting: Cardiovascular Disease

## 2012-01-09 ENCOUNTER — Other Ambulatory Visit: Payer: Self-pay | Admitting: Cardiovascular Disease

## 2012-04-14 ENCOUNTER — Ambulatory Visit (INDEPENDENT_AMBULATORY_CARE_PROVIDER_SITE_OTHER): Payer: BC Managed Care – PPO | Admitting: Physician Assistant

## 2012-04-14 VITALS — BP 144/80 | HR 71 | Temp 98.6°F | Resp 17 | Ht 74.5 in | Wt 180.0 lb

## 2012-04-14 DIAGNOSIS — R82998 Other abnormal findings in urine: Secondary | ICD-10-CM

## 2012-04-14 DIAGNOSIS — N320 Bladder-neck obstruction: Secondary | ICD-10-CM

## 2012-04-14 DIAGNOSIS — R8290 Unspecified abnormal findings in urine: Secondary | ICD-10-CM

## 2012-04-14 LAB — POCT URINALYSIS DIPSTICK
Bilirubin, UA: NEGATIVE
Glucose, UA: NEGATIVE
Ketones, UA: NEGATIVE
Nitrite, UA: NEGATIVE
pH, UA: 7

## 2012-04-14 LAB — POCT UA - MICROSCOPIC ONLY
Mucus, UA: NEGATIVE
Yeast, UA: NEGATIVE

## 2012-04-14 MED ORDER — CIPROFLOXACIN HCL 500 MG PO TABS: 500.0000 mg | ORAL_TABLET | Freq: Two times a day (BID) | ORAL | Status: DC

## 2012-04-14 NOTE — Progress Notes (Signed)
Subjective:    Patient ID: Connor Walker, male    DOB: 1946/01/24, 66 y.o.   MRN: [id number]  HPI This 66 y.o. male presents for evaluation of urine odor.  He has known bladder outlet obstruction and neurogenic bladder.  He performs self I&O cath TID.  No pain with urination, but foul urine odor.  Low grade fever last night, 100.3, mild low back pain.  He has had this on several previous occasions.  No nausea, vomiting, diarrhea, CP, SOB.  Some generalized achiness yesterday.  Review of Systems As above.  Past Medical History  Diagnosis Date  . Coronary artery disease June 2010    CAD native vessel with drug eluting stents in RCA and Circumflex  . Onycholysis   . Neurogenic bladder     Past Surgical History  Procedure Date  . Cataract extraction     cataract surgery bilateral    Prior to Admission medications   Medication Sig Start Date End Date Taking? Authorizing Provider  aspirin 81 MG EC tablet Take 81 mg by mouth daily.     Yes Historical Provider, MD  clopidogrel (PLAVIX) 75 MG tablet TAKE 1 TABLET (75 MG TOTAL) BY MOUTH DAILY. 01/09/12  Yes Kathleene Hazel, MD  metoprolol tartrate (LOPRESSOR) 25 MG tablet TAKE 1/2 TABLET BY MOUTH TWICE A DAY 11/02/11  Yes Kathleene Hazel, MD  nitroGLYCERIN (NITROSTAT) 0.4 MG SL tablet Place 1 tablet (0.4 mg total) under the tongue every 5 (five) minutes as needed. Up to 3 doses 10/25/10  Yes Kathleene Hazel, MD  simvastatin (ZOCOR) 40 MG tablet TAKE 1 TABLET (40 MG TOTAL) BY MOUTH AT BEDTIME. 01/09/12  Yes Kathleene Hazel, MD    No Known Allergies  History   Social History  . Marital Status: Married    Spouse Name: Gavin Pound    Number of Children: 3  . Years of Education: 13   Occupational History  . SALES    Social History Main Topics  . Smoking status: Former Smoker    Quit date: 10/24/1985  . Smokeless tobacco: Never Used  . Alcohol Use: Yes     Comment: on weekends  . Drug Use: No  . Sexually  Active: Yes -- Male partner(s)   Other Topics Concern  . Not on file   Social History Narrative   Full time-[employer]. Married, 4 children(3 biological) (Wife - Deborrah, works at [employer])    Family History  Problem Relation Age of Onset  . Coronary artery disease Neg Hx   . Hypertension Sister   . Leukemia Brother        Objective:   Physical Exam  Blood pressure 144/80, pulse 71, temperature 98.6 F (37 C), temperature source Oral, resp. rate 17, height 6' 2.5" (1.892 m), weight 180 lb (81.647 kg), SpO2 98.00%. Body mass index is 22.80 kg/(m^2). Well-developed, well nourished WF who is awake, alert and oriented, in NAD. HEENT: [place]/AT, sclera and conjunctiva are clear.   Neck: supple, non-tender, no lymphadenopathy, thyromegaly. Heart: RRR, no murmur Lungs: normal effort, CTA Abdomen: normo-active bowel sounds, supple, non-tender, no mass or organomegaly. Extremities: no cyanosis, clubbing or edema. Skin: warm and dry. Psychologic: good mood and appropriate affect, normal speech and behavior.  Results for orders placed in visit on 04/14/12  POCT UA - MICROSCOPIC ONLY      Component Value Range   WBC, Ur, HPF, POC 15-20     RBC, urine, microscopic 3-5  Bacteria, U Microscopic 2+     Mucus, UA neg     Epithelial cells, urine per micros 2-4     Crystals, Ur, HPF, POC neg     Casts, Ur, LPF, POC neg     Yeast, UA neg    POCT URINALYSIS DIPSTICK      Component Value Range   Color, UA yellow     Clarity, UA cloudy     Glucose, UA neg     Bilirubin, UA neg     Ketones, UA neg     Spec Grav, UA 1.015     Blood, UA trace     pH, UA 7.0     Protein, UA neg     Urobilinogen, UA 0.2     Nitrite, UA neg     Leukocytes, UA small (1+)        Assessment & Plan:   1. Bad odor of urine  POCT UA - Microscopic Only, POCT urinalysis dipstick  2. Bladder outlet obstruction  Urine culture   Cipro 500 mg 1 PO BID x 10 days.  Supportive care.  Await UCx. RTC  if symptoms worsen/persist.

## 2012-04-14 NOTE — Patient Instructions (Signed)
Get plenty of rest and drink at least 64 ounces of water daily. 

## 2012-04-16 LAB — URINE CULTURE: Colony Count: >=100000

## 2012-06-10 ENCOUNTER — Other Ambulatory Visit: Payer: Self-pay | Admitting: Cardiovascular Disease

## 2012-07-02 ENCOUNTER — Telehealth: Payer: Self-pay | Admitting: Cardiovascular Disease

## 2012-07-02 NOTE — Telephone Encounter (Signed)
Spoke with pt and told him he should not take dayquil

## 2012-07-02 NOTE — Telephone Encounter (Signed)
Left message to call back  

## 2012-07-02 NOTE — Telephone Encounter (Signed)
New problem:   Wants to know can he take dayquil

## 2012-08-07 ENCOUNTER — Other Ambulatory Visit: Payer: Self-pay | Admitting: Cardiovascular Disease

## 2012-10-16 ENCOUNTER — Ambulatory Visit (INDEPENDENT_AMBULATORY_CARE_PROVIDER_SITE_OTHER): Payer: BC Managed Care – PPO | Admitting: Cardiovascular Disease

## 2012-10-16 ENCOUNTER — Encounter: Payer: Self-pay | Admitting: Cardiovascular Disease

## 2012-10-16 VITALS — BP 122/68 | HR 68 | Ht 74.0 in | Wt 181.8 lb

## 2012-10-16 DIAGNOSIS — I2510 Atherosclerotic heart disease of native coronary artery without angina pectoris: Secondary | ICD-10-CM

## 2012-10-16 NOTE — Patient Instructions (Signed)
Your physician wants you to follow-up in: 12 months. You will receive a reminder letter in the mail two months in advance. If you don't receive a letter, please call our office to schedule the follow-up appointment.  Your physician has recommended you make the following change in your medication:  Stop Clopidogrel.

## 2012-10-16 NOTE — Progress Notes (Signed)
History of Present Illness: 67 yo WM with CAD here today for follow up. He was admitted from Oct 24, 2008 until October 28, 2008 with a NSTEMI. I placed drug eluting stents in the proximal and mid RCA and in the mid Circumflex. He has done well since then.   He is here today for f/u. No chest pain, SOB, palpitations, dizziness, near syncope or syncope. He has been tolerating his medications well. He does not exercise. He is still walking his dog. He has a Animator job. Lipids are ok.   Primary Care Physician: Dr. Artist Pais   Last Lipid Profile: Checked at work and ok per pt.    Past Medical History  Diagnosis Date  . Coronary artery disease June 2010    CAD native vessel with drug eluting stents in RCA and Circumflex  . Onycholysis   . Neurogenic bladder     Past Surgical History  Procedure Laterality Date  . Cataract extraction      cataract surgery bilateral    Current Outpatient Prescriptions  Medication Sig Dispense Refill  . aspirin 81 MG EC tablet Take 81 mg by mouth daily.        . ciprofloxacin (CIPRO) 500 MG tablet Take 1 tablet (500 mg total) by mouth 2 (two) times daily.  20 tablet  0  . clopidogrel (PLAVIX) 75 MG tablet TAKE 1 TABLET (75 MG TOTAL) BY MOUTH DAILY.  30 tablet  6  . metoprolol tartrate (LOPRESSOR) 25 MG tablet TAKE 1/2 TABLET BY MOUTH TWICE A DAY  30 tablet  6  . nitroGLYCERIN (NITROSTAT) 0.4 MG SL tablet Place 1 tablet (0.4 mg total) under the tongue every 5 (five) minutes as needed. Up to 3 doses  25 tablet  3  . simvastatin (ZOCOR) 40 MG tablet TAKE 1 TABLET (40 MG TOTAL) BY MOUTH AT BEDTIME.  30 tablet  6   No current facility-administered medications for this visit.    No Known Allergies  History   Social History  . Marital Status: Married    Spouse Name: Gavin Pound    Number of Children: 3  . Years of Education: 13   Occupational History  . SALES    Social History Main Topics  . Smoking status: Former Smoker    Quit date: 10/24/1985  .  Smokeless tobacco: Never Used  . Alcohol Use: Yes     Comment: on weekends  . Drug Use: No  . Sexually Active: Yes -- Male partner(s)   Other Topics Concern  . Not on file   Social History Narrative   Full time-[employer]. Married, 4 children(3 biological) (Wife - Deborrah, works at [employer])    Family History  Problem Relation Age of Onset  . Coronary artery disease Neg Hx   . Hypertension Sister   . Leukemia Brother     Review of Systems:  As stated in the HPI and otherwise negative.   BP 122/68  Pulse 68  Ht 6\' 2"  (1.88 m)  Wt 181 lb 12.8 oz (82.464 kg)  BMI 23.33 kg/m2  Physical Examination: General: Well developed, well nourished, NAD HEENT: OP clear, mucus membranes moist SKIN: warm, dry. No rashes. Neuro: No focal deficits Musculoskeletal: Muscle strength 5/5 all ext Psychiatric: Mood and affect normal Neck: No JVD, no carotid bruits, no thyromegaly, no lymphadenopathy. Lungs:Clear bilaterally, no wheezes, rhonci, crackles Cardiovascular: Regular rate and rhythm. No murmurs, gallops or rubs. Abdomen:Soft. Bowel sounds present. Non-tender.  Extremities: No lower extremity edema.  Pulses are 2 + in the bilateral DP/PT.  EKG: NSR, rate 64 bpm. Normal EKG  Assessment and Plan:   1. CAD: Stable. He is on good medical therapy. Will stop Plavix. Will continue ASA, statin, beta blocker. BP is well controlled. He has had recent lipids at work and they are well controlled per report. Does not wish to have any lipids checked today. Will check LFTs today since he is on the statin.  I will see him in one year

## 2012-10-17 LAB — HEPATIC FUNCTION PANEL
AST: 31 U/L (ref 0–37)
Albumin: 4.2 g/dL (ref 3.5–5.2)
Alkaline Phosphatase: 46 U/L (ref 39–117)
Total Bilirubin: 0.6 mg/dL (ref 0.3–1.2)

## 2012-10-22 ENCOUNTER — Telehealth: Payer: Self-pay | Admitting: Cardiovascular Disease

## 2012-10-22 NOTE — Telephone Encounter (Signed)
Spoke with patient who called [place] back for lab results.  Results reviewed with patient who verbalized understanding.

## 2012-10-22 NOTE — Telephone Encounter (Signed)
Follow Up     Pt calling in returning phone call from yesterday. Please call.

## 2012-11-21 ENCOUNTER — Ambulatory Visit (INDEPENDENT_AMBULATORY_CARE_PROVIDER_SITE_OTHER): Payer: BC Managed Care – PPO | Admitting: Physician Assistant

## 2012-11-21 VITALS — BP 118/72 | HR 86 | Temp 98.0°F | Resp 16 | Ht 74.0 in | Wt 179.0 lb

## 2012-11-21 DIAGNOSIS — J029 Acute pharyngitis, unspecified: Secondary | ICD-10-CM

## 2012-11-21 LAB — POCT RAPID STREP A (OFFICE): Rapid Strep A Screen: NEGATIVE

## 2012-11-21 MED ORDER — AMOXICILLIN 875 MG PO TABS: 875.0000 mg | ORAL_TABLET | Freq: Two times a day (BID) | ORAL | Status: DC

## 2012-11-21 MED ORDER — FIRST-DUKES MOUTHWASH MT SUSP: 5.0000 mL | OROMUCOSAL | Status: DC | PRN

## 2012-11-21 NOTE — Progress Notes (Signed)
  Subjective:    Patient ID: Connor Walker, male    DOB: 1945-06-29, 67 y.o.   MRN: [id number]  HPI 67 year old male presents with acute onset of sore throat. Symptoms started yesterday and have progressively worsened.  Admits it is now painful to swallow.  Denies nasal congestion, rhinorrhea, cough, otalgia, fever, chills, nausea, or vomiting. Does have a slight headache today.  Took Tylenol at 3:30 a.m this morning which helped with the pain.  No known exposure to strep or significant hx of recurrent strep infections.   Patient is otherwise doing well with no other concerns today.     Review of Systems  Constitutional: Negative for fever and chills.  HENT: Positive for sore throat. Negative for ear pain, congestion, rhinorrhea, trouble swallowing and postnasal drip.   Respiratory: Negative for cough.   Gastrointestinal: Negative for nausea and vomiting.  Neurological: Positive for headaches.       Objective:   Physical Exam  Constitutional: He is oriented to person, place, and time. He appears well-developed and well-nourished.  HENT:  Head: Normocephalic and atraumatic.  Right Ear: Hearing, tympanic membrane, external ear and ear canal normal.  Left Ear: Hearing, tympanic membrane, external ear and ear canal normal.  Mouth/Throat: Uvula is midline and mucous membranes are normal. Posterior oropharyngeal erythema present. No oropharyngeal exudate, posterior oropharyngeal edema or tonsillar abscesses.  Eyes: Conjunctivae are normal.  Neck: Normal range of motion.  Cardiovascular: Normal rate, regular rhythm and normal heart sounds.   Pulmonary/Chest: Effort normal and breath sounds normal.  Lymphadenopathy:    He has cervical adenopathy (AC).  Neurological: He is alert and oriented to person, place, and time.  Psychiatric: He has a normal mood and affect. His behavior is normal. Judgment and thought content normal.    Results for orders placed in visit on 11/21/12  POCT RAPID  STREP A (OFFICE)      Result Value Range   Rapid Strep A Screen Negative  Negative         Assessment & Plan:  Acute pharyngitis - Plan: POCT rapid strep A Throat culture pending Likely viral URI - will treat symptomatically with Duke's mouthwash and Advil prn pain If no improvement in 3-4 days, may fill amoxicillin 875 mg bid Increase fluids and rest Follow up if symptoms worsen or fail to improve.

## 2012-11-23 LAB — CULTURE, GROUP A STREP: Organism ID, Bacteria: NORMAL

## 2013-01-09 ENCOUNTER — Other Ambulatory Visit: Payer: Self-pay | Admitting: Cardiovascular Disease

## 2013-03-09 ENCOUNTER — Other Ambulatory Visit: Payer: Self-pay | Admitting: Cardiovascular Disease

## 2013-08-13 ENCOUNTER — Other Ambulatory Visit: Payer: Self-pay | Admitting: Cardiovascular Disease

## 2013-10-09 ENCOUNTER — Other Ambulatory Visit: Payer: Self-pay | Admitting: Cardiovascular Disease

## 2013-10-22 ENCOUNTER — Ambulatory Visit (INDEPENDENT_AMBULATORY_CARE_PROVIDER_SITE_OTHER): Payer: PRIVATE HEALTH INSURANCE | Admitting: Cardiovascular Disease

## 2013-10-22 ENCOUNTER — Encounter (INDEPENDENT_AMBULATORY_CARE_PROVIDER_SITE_OTHER): Payer: Self-pay

## 2013-10-22 ENCOUNTER — Encounter: Payer: Self-pay | Admitting: Cardiovascular Disease

## 2013-10-22 VITALS — BP 134/76 | HR 60 | Ht 74.0 in | Wt 185.0 lb

## 2013-10-22 DIAGNOSIS — I2510 Atherosclerotic heart disease of native coronary artery without angina pectoris: Secondary | ICD-10-CM

## 2013-10-22 DIAGNOSIS — E785 Hyperlipidemia, unspecified: Secondary | ICD-10-CM

## 2013-10-22 LAB — HEPATIC FUNCTION PANEL
ALT: 42 U/L (ref 0–53)
AST: 32 U/L (ref 0–37)
Albumin: 4.3 g/dL (ref 3.5–5.2)
Alkaline Phosphatase: 43 U/L (ref 39–117)
BILIRUBIN DIRECT: 0 mg/dL (ref 0.0–0.3)
TOTAL PROTEIN: 7 g/dL (ref 6.0–8.3)
Total Bilirubin: 0.5 mg/dL (ref 0.2–1.2)

## 2013-10-22 LAB — LIPID PANEL
Cholesterol: 181 mg/dL (ref 0–200)
HDL: 49.4 mg/dL (ref >39.00)
LDL CALC: 113 mg/dL — AB (ref 0–99)
Total CHOL/HDL Ratio: 4
Triglycerides: 93 mg/dL (ref 0.0–149.0)
VLDL: 18.6 mg/dL (ref 0.0–40.0)

## 2013-10-22 MED ORDER — CLOPIDOGREL BISULFATE 75 MG PO TABS: 75.0000 mg | ORAL_TABLET | Freq: Every day | ORAL | Status: DC

## 2013-10-22 MED ORDER — NITROGLYCERIN 0.4 MG SL SUBL: 0.4000 mg | SUBLINGUAL_TABLET | SUBLINGUAL | Status: AC | PRN

## 2013-10-22 NOTE — Progress Notes (Signed)
History of Present Illness: 68 yo WM with CAD here today for follow up. He was admitted from Oct 24, 2008 with a NSTEMI. I placed drug eluting stents in the proximal and mid RCA and in the mid Circumflex. He has done well since then.   He is here today for f/u. No chest pain, SOB, palpitations, dizziness, near syncope or syncope. He has been tolerating his medications well. He does not exercise. He is still walking his dog for short distances.   Primary Care Physician: Dr. Artist Pais   Last Lipid Profile: Checked at work    Past Medical History  Diagnosis Date  . Coronary artery disease June 2010    CAD native vessel with drug eluting stents in RCA and Circumflex  . Onycholysis   . Neurogenic bladder     Past Surgical History  Procedure Laterality Date  . Cataract extraction      cataract surgery bilateral    Current Outpatient Prescriptions  Medication Sig Dispense Refill  . aspirin 81 MG EC tablet Take 81 mg by mouth daily.        . metoprolol tartrate (LOPRESSOR) 25 MG tablet TAKE 1/2 TABLET BY MOUTH TWICE A DAY  30 tablet  0  . nitroGLYCERIN (NITROSTAT) 0.4 MG SL tablet Place 1 tablet (0.4 mg total) under the tongue every 5 (five) minutes as needed. Up to 3 doses  25 tablet  3  . simvastatin (ZOCOR) 40 MG tablet TAKE 1 TABLET BY MOUTH AT BEDTIME.  30 tablet  0   No current facility-administered medications for this visit.    No Known Allergies  History   Social History  . Marital Status: Married    Spouse Name: Gavin Pound    Number of Children: 3  . Years of Education: 13   Occupational History  . SALES    Social History Main Topics  . Smoking status: Former Smoker    Quit date: 10/24/1985  . Smokeless tobacco: Never Used  . Alcohol Use: Yes     Comment: on weekends  . Drug Use: No  . Sexual Activity: Yes    Partners: Female   Other Topics Concern  . Not on file   Social History Narrative   Full time-[employer]. Married, 4 children(3 biological)  (Wife - Deborrah, works at [employer])    Family History  Problem Relation Age of Onset  . Coronary artery disease Neg Hx   . Hypertension Sister   . Leukemia Brother     Review of Systems:  As stated in the HPI and otherwise negative.   BP 134/76  Pulse 60  Ht 6\' 2"  (1.88 m)  Physical Examination: General: Well developed, well nourished, NAD HEENT: OP clear, mucus membranes moist SKIN: warm, dry. No rashes. Neuro: No focal deficits Musculoskeletal: Muscle strength 5/5 all ext Psychiatric: Mood and affect normal Neck: No JVD, no carotid bruits, no thyromegaly, no lymphadenopathy. Lungs:Clear bilaterally, no wheezes, rhonci, crackles Cardiovascular: Regular rate and rhythm. No murmurs, gallops or rubs. Abdomen:Soft. Bowel sounds present. Non-tender.  Extremities: No lower extremity edema. Pulses are 2 + in the bilateral DP/PT.  EKG: NSR, rate 60 bpm.   Assessment and Plan:   1. CAD: Stable. He is on good medical therapy. Will continue ASA, statin, beta blocker. Based on recent findings of DAPT trial, will restart Plavix 75 mg daily. BP is well controlled.  I encouraged heart healthy diet and frequent exercise.   2. HLD: Check lipids and LFTs  today. He is on a statin.

## 2013-10-22 NOTE — Patient Instructions (Signed)
Your physician wants you to follow-up in:  12 months.  You will receive a reminder letter in the mail two months in advance. If you don't receive a letter, please call our office to schedule the follow-up appointment.  Your physician has recommended you make the following change in your medication: Start Clopidogrel 75 mg by mouth daily.    

## 2013-10-26 ENCOUNTER — Other Ambulatory Visit: Payer: Self-pay | Admitting: *Deleted

## 2013-10-26 DIAGNOSIS — E785 Hyperlipidemia, unspecified: Secondary | ICD-10-CM

## 2013-10-26 MED ORDER — ATORVASTATIN CALCIUM 40 MG PO TABS: 40.0000 mg | ORAL_TABLET | Freq: Every day | ORAL | Status: DC

## 2013-11-11 ENCOUNTER — Other Ambulatory Visit: Payer: Self-pay | Admitting: Cardiovascular Disease

## 2014-01-19 ENCOUNTER — Other Ambulatory Visit: Payer: PRIVATE HEALTH INSURANCE

## 2014-01-28 ENCOUNTER — Other Ambulatory Visit: Payer: PRIVATE HEALTH INSURANCE

## 2014-02-04 ENCOUNTER — Other Ambulatory Visit: Payer: PRIVATE HEALTH INSURANCE

## 2014-02-08 ENCOUNTER — Other Ambulatory Visit: Payer: Self-pay | Admitting: *Deleted

## 2014-02-08 MED ORDER — METOPROLOL TARTRATE 25 MG PO TABS: ORAL_TABLET | ORAL | Status: DC

## 2014-02-08 MED ORDER — ATORVASTATIN CALCIUM 40 MG PO TABS: 40.0000 mg | ORAL_TABLET | Freq: Every day | ORAL | Status: DC

## 2014-02-08 MED ORDER — CLOPIDOGREL BISULFATE 75 MG PO TABS: 75.0000 mg | ORAL_TABLET | Freq: Every day | ORAL | Status: DC

## 2014-02-11 ENCOUNTER — Other Ambulatory Visit (INDEPENDENT_AMBULATORY_CARE_PROVIDER_SITE_OTHER): Payer: PRIVATE HEALTH INSURANCE

## 2014-02-11 DIAGNOSIS — E785 Hyperlipidemia, unspecified: Secondary | ICD-10-CM

## 2014-02-11 LAB — LIPID PANEL
CHOLESTEROL: 148 mg/dL (ref 0–200)
HDL: 41.9 mg/dL (ref >39.00)
LDL Cholesterol: 90 mg/dL (ref 0–99)
NonHDL: 106.1
TRIGLYCERIDES: 82 mg/dL (ref 0.0–149.0)
Total CHOL/HDL Ratio: 4
VLDL: 16.4 mg/dL (ref 0.0–40.0)

## 2014-02-11 LAB — HEPATIC FUNCTION PANEL
ALT: 39 U/L (ref 0–53)
AST: 29 U/L (ref 0–37)
Albumin: 4.4 g/dL (ref 3.5–5.2)
Alkaline Phosphatase: 57 U/L (ref 39–117)
Bilirubin, Direct: 0 mg/dL (ref 0.0–0.3)
TOTAL PROTEIN: 7.3 g/dL (ref 6.0–8.3)
Total Bilirubin: 0.8 mg/dL (ref 0.2–1.2)

## 2014-02-12 ENCOUNTER — Other Ambulatory Visit: Payer: Self-pay | Admitting: *Deleted

## 2014-02-12 DIAGNOSIS — E785 Hyperlipidemia, unspecified: Secondary | ICD-10-CM

## 2014-02-12 MED ORDER — ATORVASTATIN CALCIUM 80 MG PO TABS: 80.0000 mg | ORAL_TABLET | Freq: Every day | ORAL | Status: DC

## 2014-05-03 ENCOUNTER — Other Ambulatory Visit (INDEPENDENT_AMBULATORY_CARE_PROVIDER_SITE_OTHER): Payer: PRIVATE HEALTH INSURANCE | Admitting: *Deleted

## 2014-05-03 DIAGNOSIS — E785 Hyperlipidemia, unspecified: Secondary | ICD-10-CM

## 2014-05-03 LAB — LIPID PANEL
CHOL/HDL RATIO: 3
Cholesterol: 149 mg/dL (ref 0–200)
HDL: 51.5 mg/dL (ref >39.00)
LDL CALC: 77 mg/dL (ref 0–99)
NonHDL: 97.5
TRIGLYCERIDES: 101 mg/dL (ref 0.0–149.0)
VLDL: 20.2 mg/dL (ref 0.0–40.0)

## 2014-05-03 LAB — HEPATIC FUNCTION PANEL
ALT: 38 U/L (ref 0–53)
AST: 35 U/L (ref 0–37)
Albumin: 4.4 g/dL (ref 3.5–5.2)
Alkaline Phosphatase: 62 U/L (ref 39–117)
BILIRUBIN TOTAL: 0.9 mg/dL (ref 0.2–1.2)
Bilirubin, Direct: 0 mg/dL (ref 0.0–0.3)
TOTAL PROTEIN: 7 g/dL (ref 6.0–8.3)

## 2014-05-04 ENCOUNTER — Telehealth: Payer: Self-pay | Admitting: Cardiovascular Disease

## 2014-05-04 NOTE — Telephone Encounter (Signed)
Discussed lab results from 05/03/14 with patient.

## 2014-05-04 NOTE — Telephone Encounter (Signed)
Follow Up  Pt called to follow up on VM. Please call work # and ext. provided before 5 and cell number after 5.

## 2014-06-26 ENCOUNTER — Other Ambulatory Visit: Payer: Self-pay | Admitting: Cardiovascular Disease

## 2014-11-04 ENCOUNTER — Ambulatory Visit (INDEPENDENT_AMBULATORY_CARE_PROVIDER_SITE_OTHER): Payer: PRIVATE HEALTH INSURANCE | Admitting: Cardiovascular Disease

## 2014-11-04 ENCOUNTER — Encounter: Payer: Self-pay | Admitting: Cardiovascular Disease

## 2014-11-04 VITALS — BP 110/62 | HR 55 | Ht 74.0 in | Wt 186.8 lb

## 2014-11-04 DIAGNOSIS — I2510 Atherosclerotic heart disease of native coronary artery without angina pectoris: Secondary | ICD-10-CM

## 2014-11-04 DIAGNOSIS — E785 Hyperlipidemia, unspecified: Secondary | ICD-10-CM | POA: Diagnosis not present

## 2014-11-04 NOTE — Patient Instructions (Signed)
Medication Instructions:  Your physician recommends that you continue on your current medications as directed. Please refer to the Current Medication list given to you today.   Labwork: none  Testing/Procedures: none  Follow-Up: Your physician wants you to follow-up in:  12 months.  You will receive a reminder letter in the mail two months in advance. If you don't receive a letter, please call our office to schedule the follow-up appointment.        

## 2014-11-04 NOTE — Progress Notes (Signed)
Chief Complaint  Patient presents with  . Follow-up    History of Present Illness: 69 yo WM with CAD here today for follow up. He was admitted May 2010 with a NSTEMI. I placed drug eluting stents in the proximal and mid RCA and in the mid Circumflex. He has done well since then. He has not wished to pursue stress testing.   He is here today for f/u. No chest pain, SOB, palpitations, dizziness, near syncope or syncope. He has been tolerating his medications well. He does not exercise. He is still walking his dog for short distances.   Primary Care Physician: Dr. Artist Pais   Past Medical History  Diagnosis Date  . Coronary artery disease June 2010    CAD native vessel with drug eluting stents in RCA and Circumflex  . Onycholysis   . Neurogenic bladder     Past Surgical History  Procedure Laterality Date  . Cataract extraction      cataract surgery bilateral    Current Outpatient Prescriptions  Medication Sig Dispense Refill  . aspirin 81 MG EC tablet Take 81 mg by mouth daily.      Marland Kitchen atorvastatin (LIPITOR) 80 MG tablet Take 1 tablet (80 mg total) by mouth daily. 90 tablet 3  . clopidogrel (PLAVIX) 75 MG tablet Take 1 tablet by mouth  daily 90 tablet 1  . metoprolol tartrate (LOPRESSOR) 25 MG tablet Take one-half tablet by  mouth two times daily 90 tablet 1  . nitroGLYCERIN (NITROSTAT) 0.4 MG SL tablet Place 1 tablet (0.4 mg total) under the tongue every 5 (five) minutes as needed. Up to 3 doses 25 tablet 6   No current facility-administered medications for this visit.    No Known Allergies  History   Social History  . Marital Status: Married    Spouse Name: Gavin Pound  . Number of Children: 3  . Years of Education: 13   Occupational History  . SALES    Social History Main Topics  . Smoking status: Former Smoker    Quit date: 10/24/1985  . Smokeless tobacco: Never Used  . Alcohol Use: Yes     Comment: on weekends  . Drug Use: No  . Sexual Activity:    Partners: Female    Other Topics Concern  . Not on file   Social History Narrative   Full time-[employer]. Married, 4 children(3 biological) (Wife - Deborrah, works at [employer])    Family History  Problem Relation Age of Onset  . Coronary artery disease Neg Hx   . Hypertension Sister   . Leukemia Brother     Review of Systems:  As stated in the HPI and otherwise negative.   BP 110/62 mmHg  Pulse 55  Ht  (1.88 m)  Wt 186 lb 12.8 oz (84.732 kg)  BMI 23.97 kg/m2  Physical Examination: General: Well developed, well nourished, NAD HEENT: OP clear, mucus membranes moist SKIN: warm, dry. No rashes. Neuro: No focal deficits Musculoskeletal: Muscle strength 5/5 all ext Psychiatric: Mood and affect normal Neck: No JVD, no carotid bruits, no thyromegaly, no lymphadenopathy. Lungs:Clear bilaterally, no wheezes, rhonci, crackles Cardiovascular: Regular rate and rhythm. No murmurs, gallops or rubs. Abdomen:Soft. Bowel sounds present. Non-tender.  Extremities: No lower extremity edema. Pulses are 2 + in the bilateral DP/PT.  EKG:  EKG is ordered today. The ekg ordered today demonstrates sinus brady, 55 bpm.   Recent Labs: 05/03/2014: ALT 38   Lipid Panel    Component  Value Date/Time   CHOL 149 05/03/2014 0943   TRIG 101.0 05/03/2014 0943   HDL 51.50 05/03/2014 0943   CHOLHDL 3 05/03/2014 0943   VLDL 20.2 05/03/2014 0943   LDLCALC 77 05/03/2014 0943     Wt Readings from Last 3 Encounters:  11/04/14 186 lb 12.8 oz (84.732 kg)  10/22/13 185 lb (83.915 kg)  11/21/12 179 lb (81.194 kg)     Other studies Reviewed: Additional studies/ records that were reviewed today include:  Review of the above records demonstrates:  Assessment and Plan:   1. CAD: Stable. He is on good medical therapy. Will continue ASA, Plavix, statin, beta blocker.   2. HLD: He is on a statin. Lipids well controlled. Repeat December 2016 with LFTs.   Current medicines are reviewed at length with the  patient today.  The patient does not have concerns regarding medicines.  The following changes have been made:  no change  Labs/ tests ordered today include:  No orders of the defined types were placed in this encounter.    Disposition:   FU with me in 12  months  Signed, Verne Carrow, MD 11/04/2014 12:47 PM    [facility] [address] [place], [place], [place]  [address] Phone: [phone]; Fax: [phone]

## 2014-11-18 ENCOUNTER — Other Ambulatory Visit: Payer: Self-pay | Admitting: Cardiovascular Disease

## 2015-05-29 ENCOUNTER — Ambulatory Visit (INDEPENDENT_AMBULATORY_CARE_PROVIDER_SITE_OTHER): Payer: Medicare Other | Admitting: Internal Medicine

## 2015-05-29 VITALS — BP 152/78 | HR 94 | Temp 98.5°F | Resp 17 | Ht 74.0 in | Wt 186.0 lb

## 2015-05-29 DIAGNOSIS — N390 Urinary tract infection, site not specified: Secondary | ICD-10-CM

## 2015-05-29 DIAGNOSIS — N312 Flaccid neuropathic bladder, not elsewhere classified: Secondary | ICD-10-CM

## 2015-05-29 LAB — POC MICROSCOPIC URINALYSIS (UMFC): Mucus: ABSENT

## 2015-05-29 LAB — POCT URINALYSIS DIP (MANUAL ENTRY)
BILIRUBIN UA: NEGATIVE
BILIRUBIN UA: NEGATIVE
Glucose, UA: NEGATIVE
Leukocytes, UA: NEGATIVE
Nitrite, UA: NEGATIVE
PROTEIN UA: NEGATIVE
SPEC GRAV UA: 1.015
Urobilinogen, UA: 0.2
pH, UA: 7

## 2015-05-29 MED ORDER — CIPROFLOXACIN HCL 250 MG PO TABS
250.0000 mg | ORAL_TABLET | Freq: Two times a day (BID) | ORAL | Status: DC
Start: 2015-05-29 — End: 2015-09-21

## 2015-05-29 MED ORDER — CIPROFLOXACIN HCL 250 MG PO TABS: 250.0000 mg | ORAL_TABLET | Freq: Two times a day (BID) | ORAL | Status: DC

## 2015-05-29 NOTE — Progress Notes (Signed)
Subjective:  By signing my name below, I, Stann Oresung-Kai Tsai, attest that this documentation has been prepared under the direction and in the presence of Ellamae Siaobert Doolittle, MD. Electronically Signed: Stann Oresung-Kai Tsai, Scribe. 05/29/2015 , 11:43 AM .  Patient was seen in Room 14 .   Patient ID: Connor Walker, male    DOB: 08/18/1945, 70 y.o.   MRN: [id number] Chief Complaint  Patient presents with  . Urinary Tract Infection   HPI Connor Walker is a 70 y.o. male who [facility] complaining of UTI symptoms that started this morning at 3:45AM. He has an extended bladder and has a h/o multiple UTI's. He woke up with tingling in his hands, and noticed blood in urine. He denies fever.   Patient Active Problem List   Diagnosis Date Noted  . UTI 08/30/2009  . 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 WEIGHT LOSS 04/15/2009  . HYPERLIPIDEMIA-MIXED 11/08/2008  . CAD, NATIVE VESSEL 11/08/2008  . MI 11/05/2008  . CAD, UNSPECIFIED SITE 11/05/2008  . CHEST PAIN-UNSPECIFIED 11/05/2008    Current outpatient prescriptions:  .  aspirin 81 MG EC tablet, Take 81 mg by mouth daily.  , Disp: , Rfl:  .  atorvastatin (LIPITOR) 80 MG tablet, Take 1 tablet by mouth  daily, Disp: 90 tablet, Rfl: 1 .  clopidogrel (PLAVIX) 75 MG tablet, Take 1 tablet by mouth  daily, Disp: 90 tablet, Rfl: 1 .  metoprolol tartrate (LOPRESSOR) 25 MG tablet, Take one-half tablet by  mouth twice a day, Disp: 90 tablet, Rfl: 1 .  nitroGLYCERIN (NITROSTAT) 0.4 MG SL tablet, Place 1 tablet (0.4 mg total) under the tongue every 5 (five) minutes as needed. Up to 3 doses (Patient not taking: Reported on 05/29/2015), Disp: 25 tablet, Rfl: 6    Review of Systems  Constitutional: Negative for fever, chills and fatigue.  HENT: Negative for congestion and sore throat.   Respiratory: Negative for cough, shortness of breath and wheezing.   Gastrointestinal: Negative for nausea, vomiting, abdominal pain, diarrhea and  constipation.  Genitourinary: Positive for hematuria.       Objective:   Physical Exam  Constitutional: He is oriented to person, place, and time. He appears well-developed and well-nourished. No distress.  HENT:  Head: Normocephalic and atraumatic.  Eyes: EOM are normal. Pupils are equal, round, and reactive to light.  Neck: Neck supple.  Cardiovascular: Normal rate.   Pulmonary/Chest: Effort normal. No respiratory distress.  Musculoskeletal: Normal range of motion.  Neurological: He is alert and oriented to person, place, and time.  Skin: Skin is warm and dry.  Psychiatric: He has a normal mood and affect. His behavior is normal.  Nursing note and vitals reviewed.   BP 152/78 mmHg  Pulse 94  Temp(Src) 98.5 F (36.9 C) (Oral)  Resp 17  Ht 6\' 2"  (1.88 m)  Wt 186 lb (84.369 kg)  BMI 23.87 kg/m2  SpO2 97%  Results for orders placed or performed in visit on 05/29/15  POCT urinalysis dipstick  Result Value Ref Range   Color, UA yellow yellow   Clarity, UA cloudy (A) clear   Glucose, UA negative negative   Bilirubin, UA negative negative   Ketones, POC UA negative negative   Spec Grav, UA 1.015    Blood, UA trace-intact (A) negative   pH, UA 7.0    Protein Ur, POC negative negative   Urobilinogen, UA 0.2    Nitrite, UA Negative Negative   Leukocytes, UA Negative Negative  POCT Microscopic Urinalysis ([facility])  Result Value Ref Range   WBC,UR,HPF,POC Many (A) None WBC/hpf   RBC,UR,HPF,POC Few (A) None RBC/hpf   Bacteria Moderate (A) None, Too numerous to count   Mucus Absent Absent   Epithelial Cells, UR Per Microscopy None None, Too numerous to count cells/hpf      Assessment & Plan:   I have completed the patient encounter in its entirety as documented by the scribe, with editing by me where necessary. Perle P. Merla Riches, M.D.  Recurrent UTI - Plan: POCT urinalysis dipstick, POCT Microscopic Urinalysis ([facility]), Urine culture  Hypocontractile bladder  Meds  ordered this encounter  Medications  . ciprofloxacin (CIPRO) 250 MG tablet    Sig: Take 1 tablet (250 mg total) by mouth 2 (two) times daily.    Dispense:  20 tablet    Refill:  0   Call w [facility]

## 2015-05-29 NOTE — Addendum Note (Signed)
Addended by: Isaac BlissGALLOWAY, Marquice Uddin J on: 05/29/2015 12:17 PM   Modules accepted: Orders

## 2015-05-31 LAB — URINE CULTURE

## 2015-06-01 DIAGNOSIS — B078 Other viral warts: Secondary | ICD-10-CM | POA: Diagnosis not present

## 2015-06-07 DIAGNOSIS — Z0000 Encounter for general adult medical examination without abnormal findings: Secondary | ICD-10-CM | POA: Diagnosis not present

## 2015-06-07 DIAGNOSIS — N312 Flaccid neuropathic bladder, not elsewhere classified: Secondary | ICD-10-CM | POA: Diagnosis not present

## 2015-07-06 DIAGNOSIS — B078 Other viral warts: Secondary | ICD-10-CM | POA: Diagnosis not present

## 2015-07-06 DIAGNOSIS — B079 Viral wart, unspecified: Secondary | ICD-10-CM | POA: Diagnosis not present

## 2015-07-14 DIAGNOSIS — R339 Retention of urine, unspecified: Secondary | ICD-10-CM | POA: Diagnosis not present

## 2015-07-19 ENCOUNTER — Other Ambulatory Visit: Payer: Self-pay | Admitting: Cardiovascular Disease

## 2015-07-27 DIAGNOSIS — B078 Other viral warts: Secondary | ICD-10-CM | POA: Diagnosis not present

## 2015-08-17 DIAGNOSIS — R6889 Other general symptoms and signs: Secondary | ICD-10-CM | POA: Diagnosis not present

## 2015-09-07 DIAGNOSIS — B078 Other viral warts: Secondary | ICD-10-CM | POA: Diagnosis not present

## 2015-09-21 ENCOUNTER — Encounter: Payer: Self-pay | Admitting: Medical

## 2015-09-21 ENCOUNTER — Ambulatory Visit (INDEPENDENT_AMBULATORY_CARE_PROVIDER_SITE_OTHER): Payer: Medicare Other | Admitting: Medical

## 2015-09-21 VITALS — BP 122/76 | HR 59 | Temp 97.8°F | Ht 74.0 in | Wt 184.0 lb

## 2015-09-21 DIAGNOSIS — R1013 Epigastric pain: Secondary | ICD-10-CM | POA: Diagnosis not present

## 2015-09-21 LAB — CBC WITH DIFFERENTIAL/PLATELET
BASOS ABS: 0 10*3/uL (ref 0.0–0.1)
Basophils Relative: 0.6 % (ref 0.0–3.0)
Eosinophils Absolute: 0.1 10*3/uL (ref 0.0–0.7)
Eosinophils Relative: 1.2 % (ref 0.0–5.0)
HEMATOCRIT: 45.6 % (ref 39.0–52.0)
Hemoglobin: 15.5 g/dL (ref 13.0–17.0)
LYMPHS PCT: 33.8 % (ref 12.0–46.0)
Lymphs Abs: 1.5 10*3/uL (ref 0.7–4.0)
MCHC: 34 g/dL (ref 30.0–36.0)
MCV: 99.6 fl (ref 78.0–100.0)
MONOS PCT: 13.5 % — AB (ref 3.0–12.0)
Monocytes Absolute: 0.6 10*3/uL (ref 0.1–1.0)
Neutro Abs: 2.3 10*3/uL (ref 1.4–7.7)
Neutrophils Relative %: 50.9 % (ref 43.0–77.0)
PLATELETS: 202 10*3/uL (ref 150.0–400.0)
RBC: 4.57 Mil/uL (ref 4.22–5.81)
RDW: 14.3 % (ref 11.5–15.5)
WBC: 4.6 10*3/uL (ref 4.0–10.5)

## 2015-09-21 LAB — COMPREHENSIVE METABOLIC PANEL
ALBUMIN: 4.6 g/dL (ref 3.5–5.2)
ALK PHOS: 49 U/L (ref 39–117)
ALT: 38 U/L (ref 0–53)
AST: 33 U/L (ref 0–37)
BILIRUBIN TOTAL: 0.7 mg/dL (ref 0.2–1.2)
BUN: 10 mg/dL (ref 6–23)
CALCIUM: 9.8 mg/dL (ref 8.4–10.5)
CO2: 31 mEq/L (ref 19–32)
Chloride: 101 mEq/L (ref 96–112)
Creatinine, Ser: 0.82 mg/dL (ref 0.40–1.50)
GFR: 99.29 mL/min (ref >60.00)
Glucose, Bld: 94 mg/dL (ref 70–99)
Potassium: 4.5 mEq/L (ref 3.5–5.1)
Sodium: 137 mEq/L (ref 135–145)
TOTAL PROTEIN: 7.1 g/dL (ref 6.0–8.3)

## 2015-09-21 LAB — AMYLASE: Amylase: 43 U/L (ref 27–131)

## 2015-09-21 LAB — LIPASE: Lipase: 37 U/L (ref 11.0–59.0)

## 2015-09-21 NOTE — Patient Instructions (Signed)
For your likely reflux continue your med otc intermittently. When we call you on lab results please give us name of the med.  We will do labs for abdomen pain today. To include cbc, cmp, amylase, lipase and h pylori.  Eat healthy diet as discussed.   If any severe type abdomen pain then ED evaluation.  Follow up in 3-4 weeks or as needed.(will specify time frame after lab results are back).  Food Choices for Gastroesophageal Reflux Disease, Adult When you have gastroesophageal reflux disease (GERD), the foods you eat and your eating habits are very important. Choosing the right foods can help ease the discomfort of GERD. WHAT GENERAL GUIDELINES DO I NEED TO FOLLOW?  Choose fruits, vegetables, whole grains, low-fat dairy products, and low-fat meat, fish, and poultry.  Limit fats such as oils, salad dressings, butter, nuts, and avocado.  Keep a food diary to identify foods that cause symptoms.  Avoid foods that cause reflux. These may be different for different people.  Eat frequent small meals instead of three large meals each day.  Eat your meals slowly, in a relaxed setting.  Limit fried foods.  Cook foods using methods other than frying.  Avoid drinking alcohol.  Avoid drinking large amounts of liquids with your meals.  Avoid bending over or lying down until 2-3 hours after eating. WHAT FOODS ARE NOT RECOMMENDED? The following are some foods and drinks that may worsen your symptoms: Vegetables Tomatoes. Tomato juice. Tomato and spaghetti sauce. Chili peppers. Onion and garlic. Horseradish. Fruits Oranges, grapefruit, and lemon (fruit and juice). Meats High-fat meats, fish, and poultry. This includes hot dogs, ribs, ham, sausage, salami, and bacon. Dairy Whole milk and chocolate milk. Sour cream. Cream. Butter. Ice cream. Cream cheese.  Beverages Coffee and tea, with or without caffeine. Carbonated beverages or energy drinks. Condiments Hot sauce. Barbecue sauce.   Sweets/Desserts Chocolate and cocoa. Donuts. Peppermint and spearmint. Fats and Oils High-fat foods, including [place] fries and potato chips. Other Vinegar. Strong spices, such as black pepper, white pepper, red pepper, cayenne, curry powder, cloves, ginger, and chili powder. The items listed above may not be a complete list of foods and beverages to avoid. Contact your dietitian for more information.   This information is not intended to replace advice given to you by your health care provider. Make sure you discuss any questions you have with your health care provider.   Document Released: 05/14/2005 Document Revised: 06/04/2014 Document Reviewed: 03/18/2013 Elsevier Interactive Patient Education [employer]2016 Elsevier Inc.

## 2015-09-21 NOTE — Progress Notes (Addendum)
Subjective:    Patient ID: Connor Walker, male    DOB: 08/19/1945, 70 y.o.   MRN: [id number]  HPI  I have reviewed pt PMH, PSH, FH, Social History and Surgical History.  Pt has not seen pcp for about 4 years.  Pt works as Veterinary surgeonrealtor. Pt walks his dog daily. Drinks 2-3 cups coffee a day. Pt states moderate healthy diet.   Pt does have history of heartburn. Last 2 months little worse. He states eating more at night since busy during the day and not eating much during the day. One night this past week was noticing pain lying supine. Pt tried otc med. He not sure what type. May have been a ppi. He is only taking the tablet intermittently. With the pain will note he is belching a lot. When he has symptoms notes sitting up relieves the reflux symptoms. Last time felt gerd was night before last. No abdomen pain presently.  Pt never had history of colonoscopy. Pt states old MD was hesitant to order since he was on blood thinner. No hx of colon cancer in the family.  Pt also has some history of distended bladder. He has to cath himself daily. January 1st was his last uti. He takes cranberry tabs.    Review of Systems  Constitutional: Negative for fever, chills and fatigue.  Respiratory: Negative for cough, chest tightness, shortness of breath and wheezing.   Cardiovascular: Negative for chest pain and palpitations.  Gastrointestinal: Positive for abdominal pain. Negative for nausea, vomiting, diarrhea, constipation, blood in stool, abdominal distention and rectal pain.  Musculoskeletal: Negative for back pain.  Skin: Negative for rash.  Hematological: Negative for adenopathy. Does not bruise/bleed easily.     Past Medical History  Diagnosis Date  . Coronary artery disease June 2010    CAD native vessel with drug eluting stents in RCA and Circumflex  . Onycholysis   . Neurogenic bladder      Social History   Social History  . Marital Status: Married    Spouse Name: Connor Walker  . Number  of Children: 3  . Years of Education: 13   Occupational History  . SALES    Social History Main Topics  . Smoking status: Former Smoker    Quit date: 10/24/1985  . Smokeless tobacco: Never Used  . Alcohol Use: Yes     Comment: on weekends  . Drug Use: No  . Sexual Activity:    Partners: Female   Other Topics Concern  . Not on file   Social History Narrative   Full time-[employer]. Married, 4 children(3 biological) (Wife - Deborrah, works at [employer])    Past Surgical History  Procedure Laterality Date  . Cataract extraction      cataract surgery bilateral    Family History  Problem Relation Age of Onset  . Coronary artery disease Neg Hx   . Hypertension Sister   . Leukemia Brother     No Known Allergies  Current Outpatient Prescriptions on File Prior to Visit  Medication Sig Dispense Refill  . aspirin 81 MG EC tablet Take 81 mg by mouth daily.      Marland Kitchen. atorvastatin (LIPITOR) 80 MG tablet Take 1 tablet by mouth  daily 90 tablet 3  . clopidogrel (PLAVIX) 75 MG tablet Take 1 tablet by mouth  daily 90 tablet 3  . metoprolol tartrate (LOPRESSOR) 25 MG tablet Take one-half tablet by  mouth twice a day 90 tablet 3  .  nitroGLYCERIN (NITROSTAT) 0.4 MG SL tablet Place 1 tablet (0.4 mg total) under the tongue every 5 (five) minutes as needed. Up to 3 doses 25 tablet 6   No current facility-administered medications on file prior to visit.    BP 122/76 mmHg  Pulse 59  Temp(Src) 97.8 F (36.6 C) (Oral)  Ht  (1.88 m)  Wt 184 lb (83.462 kg)  BMI 23.61 kg/m2  SpO2 97%       Objective:   Physical Exam  General Appearance- Not in acute distress.  HEENT Eyes- Scleraeral/Conjuntiva-bilat- Not Yellow. Mouth & Throat- Normal.  Chest and Lung Exam Auscultation: Breath sounds:-Normal. Adventitious sounds:- No Adventitious sounds.  Cardiovascular Auscultation:Rythm - Regular. Heart Sounds -Normal heart sounds.  Abdomen Inspection:-Inspection Normal.   Palpation/Perucssion: Palpation and Percussion of the abdomen reveal- Non Tender, No Rebound tenderness, No rigidity(Guarding) and No Palpable abdominal masses.  Liver:-Normal.  Spleen:- Normal.   Back- no cva tenderness.        Assessment & Plan:  For your likely reflux continue your med otc intermittently. When we call you on lab results please give [place] name of the med.  We will do labs for abdomen pain today. To include cbc, cmp, amylase, lipase and h pylori.  Eat healthy diet as discussed.   If any severe type abdomen pain then ED evaluation.  Follow up in 3-4 weeks or as needed.(will specify time frame after lab results are back).  Connor Walker, Ramon Dredge, PA-C

## 2015-09-21 NOTE — Progress Notes (Signed)
Pre visit review using our clinic review tool, if applicable. No additional management support is needed unless otherwise documented below in the visit note. 

## 2015-09-22 LAB — H. PYLORI BREATH TEST: H. PYLORI BREATH TEST: NOT DETECTED

## 2015-09-23 DIAGNOSIS — R339 Retention of urine, unspecified: Secondary | ICD-10-CM | POA: Diagnosis not present

## 2015-10-20 DIAGNOSIS — N329 Bladder disorder, unspecified: Secondary | ICD-10-CM | POA: Diagnosis not present

## 2015-11-27 DIAGNOSIS — R6889 Other general symptoms and signs: Secondary | ICD-10-CM | POA: Diagnosis not present

## 2015-12-12 DIAGNOSIS — H26491 Other secondary cataract, right eye: Secondary | ICD-10-CM | POA: Diagnosis not present

## 2015-12-12 DIAGNOSIS — Z961 Presence of intraocular lens: Secondary | ICD-10-CM | POA: Diagnosis not present

## 2015-12-12 DIAGNOSIS — H43811 Vitreous degeneration, right eye: Secondary | ICD-10-CM | POA: Diagnosis not present

## 2015-12-27 DIAGNOSIS — R6889 Other general symptoms and signs: Secondary | ICD-10-CM | POA: Diagnosis not present

## 2016-01-17 DIAGNOSIS — R6889 Other general symptoms and signs: Secondary | ICD-10-CM | POA: Diagnosis not present

## 2016-02-07 ENCOUNTER — Ambulatory Visit (HOSPITAL_BASED_OUTPATIENT_CLINIC_OR_DEPARTMENT_OTHER)
Admission: RE | Admit: 2016-02-07 | Discharge: 2016-02-07 | Disposition: A | Discharge status: Home / Self Care | Payer: Medicare Other | Source: Ambulatory Visit | Attending: Medical | Admitting: Medical

## 2016-02-07 ENCOUNTER — Telehealth: Payer: Self-pay | Admitting: Medical

## 2016-02-07 ENCOUNTER — Ambulatory Visit (INDEPENDENT_AMBULATORY_CARE_PROVIDER_SITE_OTHER): Payer: Medicare Other | Admitting: Medical

## 2016-02-07 ENCOUNTER — Encounter: Payer: Self-pay | Admitting: Medical

## 2016-02-07 VITALS — BP 138/64 | HR 76 | Temp 98.2°F | Resp 16 | Ht 74.0 in | Wt 196.3 lb

## 2016-02-07 DIAGNOSIS — L723 Sebaceous cyst: Secondary | ICD-10-CM | POA: Diagnosis not present

## 2016-02-07 DIAGNOSIS — Z720 Tobacco use: Secondary | ICD-10-CM | POA: Diagnosis not present

## 2016-02-07 DIAGNOSIS — I700 Atherosclerosis of aorta: Secondary | ICD-10-CM | POA: Diagnosis not present

## 2016-02-07 DIAGNOSIS — R05 Cough: Secondary | ICD-10-CM

## 2016-02-07 DIAGNOSIS — R059 Cough, unspecified: Secondary | ICD-10-CM

## 2016-02-07 DIAGNOSIS — Z87891 Personal history of nicotine dependence: Secondary | ICD-10-CM

## 2016-02-07 DIAGNOSIS — J439 Emphysema, unspecified: Secondary | ICD-10-CM | POA: Diagnosis not present

## 2016-02-07 MED ORDER — DOXYCYCLINE HYCLATE 100 MG PO TABS: 100.0000 mg | ORAL_TABLET | Freq: Two times a day (BID) | ORAL | 0 refills | Status: DC

## 2016-02-07 MED ORDER — LEVOCETIRIZINE DIHYDROCHLORIDE 5 MG PO TABS: 5.0000 mg | ORAL_TABLET | Freq: Every evening | ORAL | 1 refills | Status: DC

## 2016-02-07 MED ORDER — FLUTICASONE PROPIONATE 50 MCG/ACT NA SUSP: 2.0000 | Freq: Every day | NASAL | 1 refills | Status: DC

## 2016-02-07 MED FILL — DOXYCYCLINE HYCLATE 100 MG: 100 | 10 days supply | Qty: 20 | Fill #0

## 2016-02-07 MED FILL — FLUTICASONE PROP 50 MCG SPR: 50 | 30 days supply | Qty: 16 | Fill #0

## 2016-02-07 MED FILL — LEVOCETIRIZINE 5 MG TABLET: 5 | 30 days supply | Qty: 30 | Fill #0

## 2016-02-07 NOTE — Telephone Encounter (Signed)
There are no forms on Karen's computer for this pt. Please advise what the forms are in reference to and where else they might be.

## 2016-02-07 NOTE — Telephone Encounter (Signed)
Please disregard that. Error entry redarding keyboard. You already handled that on other pt. Thanks.

## 2016-02-07 NOTE — Telephone Encounter (Signed)
Pt forms are on karen computer keyboard

## 2016-02-07 NOTE — Patient Instructions (Addendum)
Cough causes commonly are  uri, bronchitis, allergies, pneumonia, reflux, asthma and copd.   Based on exam want to rx xyzal tab antihistamine and flonase nasal spray(treat potential allergy cause). See if this reduces cough.  If any daily reflux symptoms try zantac otc.  Will get cxr to assess lung field for any copd type findings or infection.  Referral to dermatologist made. If no call from them contact Victorino D[place]Jennifer or [place] in our office. For inflammed/possible infected sebacious cyst rx of docycycline antibiotic.  Follow up in 10 days or as needed

## 2016-02-07 NOTE — Progress Notes (Signed)
Pre visit review using our clinic review tool, if applicable. No additional management support is needed unless otherwise documented below in the visit note. 

## 2016-02-07 NOTE — Progress Notes (Signed)
Subjective:    Patient ID: Connor Walker, male    DOB: 1945/09/14, 70 y.o.   MRN: [id number]  HPI  Pt in for recent cough for couple for couple of months. Pt states he is little paronoid since ex-wife died of lung cancer. She was not a smoker. Pt used to smoke 30 yrs ago. He smoked 2 packs a day for 10-12 years before he stopped. Pt states this is mild dry cough. Sometimes cough like trying to clear throat other times more constant.  Pt deneis any wheezing. Sometimes feels like short of breath doing minimal activity will feel mild sob. But no chest pain. Pt states he has gained about 10 lb in 6 months. Wt gain coincided with decreased exercise. No leg swelling.  Pt does report some heart burn. He does not associate with cough. Just occurs occasionally.   Over past 2 month no obvous uri or allergy signs or symptoms.  Also rt trapezius small bump for one year. Started very tiny but and rapid increase in size over past week.    Review of Systems  Constitutional: Negative for chills, fatigue and fever.  HENT: Positive for postnasal drip. Negative for congestion, rhinorrhea, sinus pressure, sneezing, sore throat and voice change.        On exam.  Respiratory: Positive for cough and shortness of breath. Negative for chest tightness and wheezing.        Dry cough.  See hpi.  Cardiovascular: Negative for chest pain and palpitations.  Gastrointestinal: Negative for abdominal pain.  Musculoskeletal: Negative for back pain and gait problem.  Skin: Negative for rash.  Neurological: Negative for dizziness and light-headedness.  Hematological: Negative for adenopathy. Does not bruise/bleed easily.  Psychiatric/Behavioral: Negative for behavioral problems and confusion. The patient is not nervous/anxious.     Past Medical History:  Diagnosis Date  . Coronary artery disease June 2010   CAD native vessel with drug eluting stents in RCA and Circumflex  . Neurogenic bladder   . Onycholysis       Social History   Social History  . Marital status: Married    Spouse name: Gavin Pound  . Number of children: 3  . 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   Social History Main Topics  . Smoking status: Former Smoker    Quit date: 10/24/1985  . Smokeless tobacco: Never Used  . Alcohol use Yes     Comment: on weekends  . Drug use: No  . Sexual activity: Yes    Partners: Female   Other Topics Concern  . Not on file   Social History Narrative   Full time-[employer]. Married, 4 children(3 biological) (Wife - Deborrah, works at [employer])    Past Surgical History:  Procedure Laterality Date  . CATARACT EXTRACTION     cataract surgery bilateral    Family History  Problem Relation Age of Onset  . Coronary artery disease Neg Hx   . Hypertension Sister   . Leukemia Brother   . Cancer Mother   . Hypertension Father   . Hyperlipidemia Father     No Known Allergies  Current Outpatient Prescriptions on File Prior to Visit  Medication Sig Dispense Refill  . aspirin 81 MG EC tablet Take 81 mg by mouth daily.      Marland Kitchen atorvastatin (LIPITOR) 80 MG tablet Take 1 tablet by mouth  daily 90 tablet 3  . clopidogrel (PLAVIX) 75 MG tablet  Take 1 tablet by mouth  daily 90 tablet 3  . metoprolol tartrate (LOPRESSOR) 25 MG tablet Take one-half tablet by  mouth twice a day 90 tablet 3  . nitroGLYCERIN (NITROSTAT) 0.4 MG SL tablet Place 1 tablet (0.4 mg total) under the tongue every 5 (five) minutes as needed. Up to 3 doses 25 tablet 6   No current facility-administered medications on file prior to visit.     BP 138/64 (BP Location: Left Arm, Patient Position: Sitting, Cuff Size: Normal)   Pulse 76   Temp 98.2 F (36.8 C) (Oral)   Resp 16   Ht 6\' 2"  (1.88 m)   Wt 196 lb 4.8 oz (89 kg)   SpO2 98%   BMI 25.20 kg/m       Objective:   Physical Exam   General  Mental Status - Alert. General Appearance - Well groomed. Not in acute  distress.  Skin Rashes- No Rashes.  HEENT Head- Normal. Ear Auditory Canal - Left- Normal. Right - Normal.Tympanic Membrane- Left- Normal. Right- Normal. Eye Sclera/Conjunctiva- Left- Normal. Right- Normal. Nose & Sinuses Nasal Mucosa- Left-  Boggy and Congested. Right-  Boggy and  Congested.Bilateral no  maxillary and no  frontal sinus pressure. Mouth & Throat Lips: Upper Lip- Normal: no dryness, cracking, pallor, cyanosis, or vesicular eruption. Lower Lip-Normal: no dryness, cracking, pallor, cyanosis or vesicular eruption. Buccal Mucosa- Bilateral- No Aphthous ulcers. Oropharynx- No Discharge or Erythema. Tonsils: Characteristics- Bilateral- No Erythema or Congestion. Size/Enlargement- Bilateral- No enlargement. Discharge- bilateral-None.  Neck Neck- Supple. No Masses.    Chest and Lung Exam Auscultation: Breath Sounds:-Clear even and unlabored.  Cardiovascular Auscultation:Rythm- Regular, rate and rhythm. Murmurs & Other Heart Sounds:Ausculatation of the heart reveal- No Murmurs.  Lymphatic Head & Neck General Head & Neck Lymphatics: Bilateral: Description- No Localized lymphadenopathy.  Skin- upper back/lower trap 1.5 cm sebaceous cyst and lipoma.   Lower ext- no pedal edema.        Assessment & Plan:   Cough causes commonly are  uri, bronchitis, allergies, pneumonia, reflux, asthma and copd.   Based on exam want to rx xyzal tab antihistamine and flonase nasal spray. See if this reduces cough.  If any daily reflux symptoms try zantac otc.  Will get cxr to assess lung field for any copd type findings or infection.  Referral to dermatologist made. If no call from them contact Victorino DikeJennifer or [place] in our office. For inflammed/possible infected sebacious cyst rx of docycycline antibiotic.  Follow up in 10 days or as needed  Lester Crickenberger, Ramon DredgeEdward, V[employer] CorporationPA-C

## 2016-02-10 DIAGNOSIS — L723 Sebaceous cyst: Secondary | ICD-10-CM | POA: Diagnosis not present

## 2016-02-17 ENCOUNTER — Ambulatory Visit (INDEPENDENT_AMBULATORY_CARE_PROVIDER_SITE_OTHER): Payer: Medicare Other | Admitting: Medical

## 2016-02-17 ENCOUNTER — Encounter: Payer: Self-pay | Admitting: Medical

## 2016-02-17 VITALS — BP 140/78 | HR 81 | Temp 97.9°F | Ht 74.0 in | Wt 191.8 lb

## 2016-02-17 DIAGNOSIS — R05 Cough: Secondary | ICD-10-CM | POA: Diagnosis not present

## 2016-02-17 DIAGNOSIS — R059 Cough, unspecified: Secondary | ICD-10-CM

## 2016-02-17 DIAGNOSIS — Z23 Encounter for immunization: Secondary | ICD-10-CM

## 2016-02-17 NOTE — Addendum Note (Signed)
Addended by: Lurline HareARTER, Biagio Snelson E on: 02/17/2016 08:50 AM   Modules accepted: Orders

## 2016-02-17 NOTE — Progress Notes (Signed)
Subjective:    Patient ID: Connor BombardRobert E Marcelino, male    DOB: 05/21/1946, 70 y.o.   MRN: [id number]  HPI  Pt in for follow up.   He states his cough is much improved.   Pt xray. No indication or concern for ca by radiologist read. He had concern based on x wife who had CA in the past.   Pt xray read maybe early copd. Remote hx of smoking.  Pt never wheezes.   Cough is very rare now.  Gave doxy for sebacious cyst. Covered possible bronchitis as well.  Xyzal caused him to feel wired. Other antihistamines caused the same or other side effects.  Pt does flu vaccine in past. Will get one today.    Review of Systems  Constitutional: Negative for chills and fatigue.  HENT: Negative for congestion, ear pain, postnasal drip, sinus pressure and sore throat.   Respiratory: Negative for cough, chest tightness, shortness of breath and wheezing.   Cardiovascular: Negative for chest pain and palpitations.  Gastrointestinal: Negative for abdominal pain.  Musculoskeletal: Negative for back pain and gait problem.  Neurological: Negative for dizziness and headaches.  Hematological: Negative for adenopathy. Does not bruise/bleed easily.    Past Medical History:  Diagnosis Date  . Coronary artery disease June 2010   CAD native vessel with drug eluting stents in RCA and Circumflex  . Neurogenic bladder   . Onycholysis      Social History   Social History  . Marital status: Married    Spouse name: Gavin PoundDeborah  . Number of children: 3  . 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   Social History Main Topics  . Smoking status: Former Smoker    Quit date: 10/24/1985  . Smokeless tobacco: Never Used  . Alcohol use Yes     Comment: on weekends  . Drug use: No  . Sexual activity: Yes    Partners: Female   Other Topics Concern  . Not on file   Social History Narrative   Full time-[employer]. Married, 4 children(3 biological) (Wife - Deborrah,  works at [employer])    Past Surgical History:  Procedure Laterality Date  . CATARACT EXTRACTION     cataract surgery bilateral    Family History  Problem Relation Age of Onset  . Coronary artery disease Neg Hx   . Hypertension Sister   . Leukemia Brother   . Cancer Mother   . Hypertension Father   . Hyperlipidemia Father     No Known Allergies  Current Outpatient Prescriptions on File Prior to Visit  Medication Sig Dispense Refill  . aspirin 81 MG EC tablet Take 81 mg by mouth daily.      Marland Kitchen. atorvastatin (LIPITOR) 80 MG tablet Take 1 tablet by mouth  daily 90 tablet 3  . clopidogrel (PLAVIX) 75 MG tablet Take 1 tablet by mouth  daily 90 tablet 3  . doxycycline (VIBRA-TABS) 100 MG tablet Take 1 tablet (100 mg total) by mouth 2 (two) times daily. 20 tablet 0  . fluticasone (FLONASE) 50 MCG/ACT nasal spray Place 2 sprays into both nostrils daily. 16 g 1  . levocetirizine (XYZAL) 5 MG tablet Take 1 tablet (5 mg total) by mouth every evening. 30 tablet 1  . metoprolol tartrate (LOPRESSOR) 25 MG tablet Take one-half tablet by  mouth twice a day 90 tablet 3  . nitroGLYCERIN (NITROSTAT) 0.4 MG SL tablet Place 1 tablet (0.4 mg  total) under the tongue every 5 (five) minutes as needed. Up to 3 doses 25 tablet 6   No current facility-administered medications on file prior to visit.     BP 140/78   Pulse 81   Temp 97.9 F (36.6 C) (Oral)   Ht 6\' 2"  (1.88 m)   Wt 191 lb 12.8 oz (87 kg)   SpO2 99%   BMI 24.63 kg/m       Objective:   Physical Exam  General  Mental Status - Alert. General Appearance - Well groomed. Not in acute distress.  Skin Rashes- No Rashes.  HEENT Head- Normal. Ear Auditory Canal - Left- Normal. Right - Normal.Tympanic Membrane- Left- Normal. Right- Normal. Eye Sclera/Conjunctiva- Left- Normal. Right- Normal. Nose & Sinuses Nasal Mucosa- Left-  Boggy and Congested. Right-  Boggy and  Congested.Bilateral no  maxillary and no  frontal sinus  pressure. Mouth & Throat Lips: Upper Lip- Normal: no dryness, cracking, pallor, cyanosis, or vesicular eruption. Lower Lip-Normal: no dryness, cracking, pallor, cyanosis or vesicular eruption. Buccal Mucosa- Bilateral- No Aphthous ulcers. Oropharynx- No Discharge or Erythema. Tonsils: Characteristics- Bilateral- No Erythema or Congestion. Size/Enlargement- Bilateral- No enlargement. Discharge- bilateral-None.  Neck Neck- Supple. No Masses.   Chest and Lung Exam Auscultation: Breath Sounds:-Clear even and unlabored.  Cardiovascular Auscultation:Rythm- Regular, rate and rhythm. Murmurs & Other Heart Sounds:Ausculatation of the heart reveal- No Murmurs.  Lymphatic Head & Neck General Head & Neck Lymphatics: Bilateral: Description- No Localized lymphadenopathy.     .    Assessment & Plan:  Your cough appears largely resolved. With your response to doxycyline and xyzal cause may have been bronchitis vs allergies.  If any allergic type symptoms in future use steroid  nasal spray since can't tolerate antihistamines.  Regarding xray of possible early copd type changes please monitor for recurrent dry cough, short of breath on activity or any wheezing. In this event we might need to give inhalers and possible refer for pulmonary function testing with specialist.  Flu vaccine given today.  Follow up 2-3 months  RN for wellness or as needed  Girtrude Enslin, Ramon Dredge, [place]

## 2016-02-17 NOTE — Progress Notes (Signed)
Pre visit review using our clinic tool,if applicable. No additional management support is needed unless otherwise documented below in the visit note.  

## 2016-02-17 NOTE — Patient Instructions (Addendum)
Your cough appears largely resolved. With your response to doxycyline and xyzal cause may have been bronchitis vs allergies.  If any allergic type symptoms in future use steroid nasal spray since can't tolerate antihistamines.  Regarding xray of possible early copd type changes please monitor for recurrent dry cough, short of breath on activity or any wheezing. In this event we might need to give inhalers and possible refer for pulmonary function testing with specialist.  Fluvaccine given today.  Follow up 2-3 months  RN for wellness or as needed

## 2016-02-27 DIAGNOSIS — R339 Retention of urine, unspecified: Secondary | ICD-10-CM | POA: Diagnosis not present

## 2016-04-04 ENCOUNTER — Ambulatory Visit: Payer: Medicare Other | Admitting: *Deleted

## 2016-04-12 DIAGNOSIS — R339 Retention of urine, unspecified: Secondary | ICD-10-CM | POA: Diagnosis not present

## 2016-04-16 ENCOUNTER — Telehealth: Payer: Self-pay | Admitting: Medical

## 2016-04-16 ENCOUNTER — Ambulatory Visit (INDEPENDENT_AMBULATORY_CARE_PROVIDER_SITE_OTHER): Payer: Medicare Other | Admitting: Medical

## 2016-04-16 ENCOUNTER — Encounter: Payer: Self-pay | Admitting: Medical

## 2016-04-16 VITALS — BP 110/78

## 2016-04-16 DIAGNOSIS — R6883 Chills (without fever): Secondary | ICD-10-CM | POA: Diagnosis not present

## 2016-04-16 DIAGNOSIS — R350 Frequency of micturition: Secondary | ICD-10-CM

## 2016-04-16 LAB — POC URINALSYSI DIPSTICK (AUTOMATED)
BILIRUBIN UA: NEGATIVE
Glucose, UA: NEGATIVE
Ketones, UA: NEGATIVE
NITRITE UA: NEGATIVE
PH UA: 6
Protein, UA: NEGATIVE
RBC UA: NEGATIVE
Spec Grav, UA: 1.025
UROBILINOGEN UA: 1

## 2016-04-16 LAB — CBC WITH DIFFERENTIAL/PLATELET
BASOS ABS: 0 10*3/uL (ref 0.0–0.1)
Basophils Relative: 0.3 % (ref 0.0–3.0)
Eosinophils Absolute: 0.1 10*3/uL (ref 0.0–0.7)
Eosinophils Relative: 0.9 % (ref 0.0–5.0)
HCT: 43 % (ref 39.0–52.0)
Hemoglobin: 14.7 g/dL (ref 13.0–17.0)
LYMPHS ABS: 1 10*3/uL (ref 0.7–4.0)
Lymphocytes Relative: 15.9 % (ref 12.0–46.0)
MCHC: 34.1 g/dL (ref 30.0–36.0)
MCV: 100.4 fl — ABNORMAL HIGH (ref 78.0–100.0)
MONO ABS: 0.9 10*3/uL (ref 0.1–1.0)
Monocytes Relative: 13.7 % — ABNORMAL HIGH (ref 3.0–12.0)
NEUTROS PCT: 69.2 % (ref 43.0–77.0)
Neutro Abs: 4.4 10*3/uL (ref 1.4–7.7)
Platelets: 186 10*3/uL (ref 150.0–400.0)
RBC: 4.28 Mil/uL (ref 4.22–5.81)
RDW: 13.4 % (ref 11.5–15.5)
WBC: 6.3 10*3/uL (ref 4.0–10.5)

## 2016-04-16 LAB — PSA: PSA: 6.51 ng/mL — AB (ref 0.10–4.00)

## 2016-04-16 MED ORDER — CIPROFLOXACIN HCL 500 MG PO TABS: 500.0000 mg | ORAL_TABLET | Freq: Two times a day (BID) | ORAL | 0 refills | Status: DC

## 2016-04-16 MED FILL — CIPROFLOXACIN HCL 500 MG TA: 500 | 5 days supply | Qty: 10 | Fill #0

## 2016-04-16 NOTE — Progress Notes (Signed)
Subjective:    Patient ID: Connor Walker, male    DOB: 05/05/1946, 70 y.o.   MRN: [id number]  HPI   Pt in for some urinary symptoms. Symptoms started on Saturday.  Pt states he has to cath himself 3-4 times a day. He gets uti maybe 3 times a year. He get takes cranberry tablets daily. He state has been a while since last infection. Pt states pain now in urethra area.  NO back pain or utethra area pain.  Pt sees urologist about one time a year.  Pt mentions historically that he uses cipro for his UTI's.       Review of Systems  Constitutional: Positive for chills, fatigue and fever.  HENT: Negative for congestion, ear pain, facial swelling, nosebleeds, postnasal drip and sore throat.   Respiratory: Negative for cough, chest tightness, shortness of breath and wheezing.   Cardiovascular: Negative for chest pain and palpitations.  Gastrointestinal: Negative for abdominal pain.  Genitourinary: Positive for dysuria and frequency. Negative for decreased urine volume, flank pain, penile pain, penile swelling, testicular pain and urgency.  Musculoskeletal:       Faint achiness. He states when he get uti this will happen in the past. He states not like the flu.   Neurological: Negative for dizziness and light-headedness.  Hematological: Negative for adenopathy. Does not bruise/bleed easily.  Psychiatric/Behavioral: Negative for behavioral problems and confusion.    Past Medical History:  Diagnosis Date  . Coronary artery disease June 2010   CAD native vessel with drug eluting stents in RCA and Circumflex  . Neurogenic bladder   . Onycholysis      Social History   Social History  . Marital status: Married    Spouse name: Gavin PoundDeborah  . Number of children: 3  . 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   Social History Main Topics  . Smoking status: Former Smoker    Quit date: 10/24/1985  . Smokeless tobacco: Never Used  . Alcohol use Yes       Comment: on weekends  . Drug use: No  . Sexual activity: Yes    Partners: Female   Other Topics Concern  . Not on file   Social History Narrative   Full time-[employer]. Married, 4 children(3 biological) (Wife - Deborrah, works at [employer])    Past Surgical History:  Procedure Laterality Date  . CATARACT EXTRACTION     cataract surgery bilateral    Family History  Problem Relation Age of Onset  . Coronary artery disease Neg Hx   . Hypertension Sister   . Leukemia Brother   . Cancer Mother   . Hypertension Father   . Hyperlipidemia Father     No Known Allergies  Current Outpatient Prescriptions on File Prior to Visit  Medication Sig Dispense Refill  . aspirin 81 MG EC tablet Take 81 mg by mouth daily.      Marland Kitchen. atorvastatin (LIPITOR) 80 MG tablet Take 1 tablet by mouth  daily 90 tablet 3  . clopidogrel (PLAVIX) 75 MG tablet Take 1 tablet by mouth  daily 90 tablet 3  . levocetirizine (XYZAL) 5 MG tablet Take 1 tablet (5 mg total) by mouth every evening. 30 tablet 1  . metoprolol tartrate (LOPRESSOR) 25 MG tablet Take one-half tablet by  mouth twice a day 90 tablet 3  . nitroGLYCERIN (NITROSTAT) 0.4 MG SL tablet Place 1 tablet (0.4 mg total) under the tongue  every 5 (five) minutes as needed. Up to 3 doses 25 tablet 6   No current facility-administered medications on file prior to visit.     BP 110/78 (BP Location: Left Arm)       Objective:   Physical Exam  General Appearance- Not in acute distress.  HEENT Eyes- Scleraeral/Conjuntiva-bilat- Not Yellow. Mouth & Throat- Normal.  Chest and Lung Exam Auscultation: Breath sounds:-Normal. Adventitious sounds:- No Adventitious sounds.  Cardiovascular Auscultation:Rythm - Regular. Heart Sounds -Normal heart sounds.  Abdomen Inspection:-Inspection Normal.  Palpation/Perucssion: Palpation and Percussion of the abdomen reveal- Non Tender, No Rebound tenderness, No rigidity(Guarding) and No Palpable  abdominal masses.  Liver:-Normal.  Spleen:- Normal.   Back- no cva tenderness.      Assessment & Plan:  With your history and current symptoms will rx cipro antibiotic and order urine culture. Will follow results of culture and notify you.  Please get cbc and psa today as well. Check infection fighting cells and see if prostate inflammation component of recent symptoms.  Follow up in 10-14 days or as needed  No flu test done. Pt states he always gets faint achy when he gets uti.

## 2016-04-16 NOTE — Patient Instructions (Signed)
With your history and current symptoms will rx cipro antibiotic and order urine culture. Will follow results of culture and notify you.  Please get cbc and psa today as well. Check infection fighting cells and see if prostate inflammation component of recent symptoms.  Follow up in 10-14 days or as needed

## 2016-04-16 NOTE — Progress Notes (Signed)
Pre visit review using our clinic review tool, if applicable. No additional management support is needed unless otherwise documented below in the visit note. 

## 2016-04-16 NOTE — Telephone Encounter (Signed)
Another 10 day rx of cipro sent in to his pharmacy.

## 2016-04-18 ENCOUNTER — Telehealth: Payer: Self-pay | Admitting: Medical

## 2016-04-18 LAB — URINE CULTURE

## 2016-04-18 MED ORDER — CIPROFLOXACIN HCL 500 MG PO TABS: 500.0000 mg | ORAL_TABLET | Freq: Two times a day (BID) | ORAL | 0 refills | Status: DC

## 2016-04-18 NOTE — Telephone Encounter (Signed)
On Monday will you make sure the cipro mistake I made was corrected. Thanks.

## 2016-04-18 NOTE — Addendum Note (Signed)
Addended by: Starla LinkAKLEY, Zadkiel Dragan J on: 04/18/2016 05:15 PM   Modules accepted: Orders

## 2016-04-18 NOTE — Telephone Encounter (Signed)
Patient calling back checking on the status of Rx, patient states pharmacy doesn't have Rx

## 2016-04-18 NOTE — Telephone Encounter (Addendum)
Discussed w/ PCP. He wants pt to have 20 total days of cipro. He did not realize original rx was also sent for 10 tablets instead of 10 days. Rx resent to pharmacy for 30 tablets (15 days) in addition to original rx for 5 days of treatment to equal total treatment time of 20 days.   PCP and pharmacy aware.

## 2016-04-18 NOTE — Telephone Encounter (Addendum)
Rx sent for 10 tablets instead of 10 days. Per note below and result notes, rx was to be for 10 days (which would be 20 tablets for BID dosing). Rx resent to pharmacy for 20 tablets. Verified receipt of new rx w/ [facility] at [facility].   Edward-FYI.

## 2016-04-18 NOTE — Addendum Note (Signed)
Addended by: Starla LinkAKLEY, Alasdair Kleve J on: 04/18/2016 05:03 PM   Modules accepted: Orders

## 2016-04-20 MED FILL — CIPROFLOXACIN HCL 500 MG TA: 500 | 15 days supply | Qty: 30 | Fill #0

## 2016-04-23 NOTE — Telephone Encounter (Signed)
Yes, 15 day course was sent to pharmacy on Friday, plus the original 5 day course the pt had already received. That makes 20 total days of treatment. I verified with the pharmacy that they had the correct orders.

## 2016-05-08 ENCOUNTER — Telehealth: Payer: Self-pay | Admitting: Medical

## 2016-05-08 ENCOUNTER — Encounter: Payer: Self-pay | Admitting: Medical

## 2016-05-08 ENCOUNTER — Ambulatory Visit (INDEPENDENT_AMBULATORY_CARE_PROVIDER_SITE_OTHER): Payer: Medicare Other | Admitting: Medical

## 2016-05-08 VITALS — BP 104/62 | HR 55 | Temp 98.1°F | Ht 74.0 in | Wt 199.0 lb

## 2016-05-08 DIAGNOSIS — N410 Acute prostatitis: Secondary | ICD-10-CM

## 2016-05-08 DIAGNOSIS — R972 Elevated prostate specific antigen [PSA]: Secondary | ICD-10-CM

## 2016-05-08 DIAGNOSIS — N390 Urinary tract infection, site not specified: Secondary | ICD-10-CM

## 2016-05-08 DIAGNOSIS — N419 Inflammatory disease of prostate, unspecified: Secondary | ICD-10-CM

## 2016-05-08 LAB — PSA: PSA: 2.63 ng/mL (ref 0.10–4.00)

## 2016-05-08 NOTE — Telephone Encounter (Signed)
Referral to urologist made. Name of urologist is in my last note

## 2016-05-08 NOTE — Progress Notes (Signed)
Subjective:    Patient ID: Connor BombardRobert E Stary, male    DOB: 07/28/1945, 70 y.o.   MRN: [id number]  HPI  Pt in for follow up.    I had placed pt on cipro due to uti type symptoms and followed culture that was + and his psa was elevated. He has been on cipro since the last visit.  He gets occasional uti about twice per year since he has to cath himself. Pt sees urologist about one time a year. Pt sees Dr. Ventura Sellerstteline.   Pt had some dysuri, frequency of urination with chills on last visit.  Pt had + urine culture. It was sensitive to cipro.  Pt has history of overextended bladder in the past.  Current all of his symptoms cleared. He states after about 4 days he felt much better.      Review of Systems  Constitutional: Negative for chills, fatigue and fever.  Respiratory: Negative for cough, shortness of breath and wheezing.   Cardiovascular: Negative for chest pain and palpitations.  Gastrointestinal: Negative for abdominal pain.  Genitourinary: Negative for dysuria, frequency, hematuria, penile swelling, scrotal swelling and urgency.  Musculoskeletal: Negative for back pain.  Skin: Negative for rash.  Neurological: Negative for facial asymmetry and numbness.  Hematological: Negative for adenopathy. Does not bruise/bleed easily.     Past Medical History:  Diagnosis Date  . Coronary artery disease June 2010   CAD native vessel with drug eluting stents in RCA and Circumflex  . Neurogenic bladder   . Onycholysis      Social History   Social History  . Marital status: Married    Spouse name: Gavin PoundDeborah  . Number of children: 3  . 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   Social History Main Topics  . Smoking status: Former Smoker    Quit date: 10/24/1985  . Smokeless tobacco: Never Used  . Alcohol use Yes     Comment: on weekends  . Drug use: No  . Sexual activity: Yes    Partners: Female   Other Topics Concern  . Not on file    Social History Narrative   Full time-[employer]. Married, 4 children(3 biological) (Wife - Deborrah, works at [employer])    Past Surgical History:  Procedure Laterality Date  . CATARACT EXTRACTION     cataract surgery bilateral    Family History  Problem Relation Age of Onset  . Coronary artery disease Neg Hx   . Hypertension Sister   . Leukemia Brother   . Cancer Mother   . Hypertension Father   . Hyperlipidemia Father     No Known Allergies  Current Outpatient Prescriptions on File Prior to Visit  Medication Sig Dispense Refill  . aspirin 81 MG EC tablet Take 81 mg by mouth daily.      Marland Kitchen. atorvastatin (LIPITOR) 80 MG tablet Take 1 tablet by mouth  daily 90 tablet 3  . clopidogrel (PLAVIX) 75 MG tablet Take 1 tablet by mouth  daily 90 tablet 3  . levocetirizine (XYZAL) 5 MG tablet Take 1 tablet (5 mg total) by mouth every evening. 30 tablet 1  . metoprolol tartrate (LOPRESSOR) 25 MG tablet Take one-half tablet by  mouth twice a day 90 tablet 3  . nitroGLYCERIN (NITROSTAT) 0.4 MG SL tablet Place 1 tablet (0.4 mg total) under the tongue every 5 (five) minutes as needed. Up to 3 doses 25 tablet 6   No  current facility-administered medications on file prior to visit.     BP 104/62 (BP Location: Left Arm, Patient Position: Sitting, Cuff Size: Normal)   Pulse (!) 55   Temp 98.1 F (36.7 C) (Oral)   Ht 6\' 2"  (1.88 m)   Wt 199 lb (90.3 kg)   SpO2 99%   BMI 25.55 kg/m       Objective:   Physical Exam  General- No acute distress. Pleasant patient. Neck- Full range of motion, no jvd Lungs- Clear, even and unlabored. Heart- regular rate and rhythm. Neurologic- CNII- XII grossly intact.  Abdomen- soft, non-tender, nondistended, +bs, no rebound or guarding. Back- no cva tenderness.       Assessment & Plan:  You had recent uti and prostatitis symptoms that may have been associated with catheter use. Your symptoms  Have all resolved. We will get psa today  to see if the levels have come down to more normal level. When I get results back will review and will investigate when you have appointment with your urologist. If your psa is on high side then try to get you follow up visit with your urologist in the near future.  Decided not to repeat urine culture as you are asymptomatic and urine from cath would likely show some degree of bacteria again. Would repeat if clinically indicated. So please  update us if recurrent symptoms.  Follow up date to be determined after lab review.  Ovide Dusek, Ramon DredgeEdward, PA-C

## 2016-05-08 NOTE — Patient Instructions (Addendum)
You had recent uti and prostatitis symptoms that may have been associated with catheter use. Your symptoms  Have all resolved. We will get psa today to see if the levels have come down to more normal level. When I get results back will review and will investigate when you have appointment with your urologist. If your psa is on high side then try to get you follow up visit with your urologist in the near future.  Decided not to repeat urine culture as you are asymptomatic and urine from cath would likely show some degree of bacteria again. Would repeat if clinically indicated. So please  update us if recurrent symptoms.  Follow up date to be determined after lab review.

## 2016-05-08 NOTE — Progress Notes (Signed)
Pre visit review using our clinic review tool, if applicable. No additional management support is needed unless otherwise documented below in the visit note. 

## 2016-05-10 MED ORDER — CEFTRIAXONE SODIUM 1 G IJ SOLR: 1.0000 g | Freq: Once | INTRAMUSCULAR | Status: DC

## 2016-05-10 MED ORDER — METHYLPREDNISOLONE ACETATE 80 MG/ML IJ SUSP: 80.0000 mg | Freq: Once | INTRAMUSCULAR | Status: DC

## 2016-05-10 NOTE — Addendum Note (Signed)
Addended by: Neldon LabellaMABE, Zonnique Norkus S on: 05/10/2016 06:05 PM   Modules accepted: Orders

## 2016-05-14 DIAGNOSIS — R339 Retention of urine, unspecified: Secondary | ICD-10-CM | POA: Diagnosis not present

## 2016-06-04 NOTE — Progress Notes (Addendum)
Subjective:   Connor BombardRobert E Walker is a 71 y.o. male who presents for an Initial Medicare Annual Wellness Visit.  Review of Systems  No ROS.  Medicare Wellness Visit.  Cardiac Risk Factors include: advanced age (>5055men, 73>65 women);male gender  Sleep patterns: no sleep issues, feels rested on waking and 7 hours nightly.   Home Safety/Smoke Alarms: Feels safe in home. Smoke alarms in place.  Living environment; residence and Firearm Safety: Lives w/ wife in retirement community. 1-story house/ trailer. Seat Belt Safety/Bike Helmet: Wears seat belt.   Counseling:   Eye Exam- Dr. Dione BoozeGroat every 2 years. Dental- [facility] every 4 months.   Male:   CCS- Not on file. Pt states never had colonoscopy d/t long term anticoagulation/bleeding risk.     PSA- Follows w/ Dr. Vernie Ammonsttelin ([facility]) Lab Results  Component Value Date   PSA 2.63 05/08/2016   PSA 6.51 (H) 04/16/2016   PSA 1.10 04/15/2009     Objective:    Today's Vitals   06/05/16 0810  BP: 120/84  Pulse: 63  Resp: 14  SpO2: 99%  Weight: 194 lb 3.2 oz (88.1 kg)  Height: 6\' 2"  (1.88 m)   Body mass index is 24.93 kg/m.  Current Medications (verified) Outpatient Encounter Prescriptions as of 06/05/2016  Medication Sig  . aspirin 81 MG EC tablet Take 81 mg by mouth daily.    Marland Kitchen. atorvastatin (LIPITOR) 80 MG tablet Take 1 tablet by mouth  daily  . clopidogrel (PLAVIX) 75 MG tablet Take 1 tablet by mouth  daily  . metoprolol tartrate (LOPRESSOR) 25 MG tablet Take one-half tablet by  mouth twice a day  . nitroGLYCERIN (NITROSTAT) 0.4 MG SL tablet Place 1 tablet (0.4 mg total) under the tongue every 5 (five) minutes as needed. Up to 3 doses  . [DISCONTINUED] levocetirizine (XYZAL) 5 MG tablet Take 1 tablet (5 mg total) by mouth every evening. (Patient not taking: Reported on 06/05/2016)  . [DISCONTINUED] cefTRIAXone (ROCEPHIN) injection 1 g   . [DISCONTINUED] methylPREDNISolone acetate (DEPO-MEDROL) injection 80 mg    No  facility-administered encounter medications on file as of 06/05/2016.     Allergies (verified) Patient has no known allergies.   History: Past Medical History:  Diagnosis Date  . Coronary artery disease June 2010   CAD native vessel with drug eluting stents in RCA and Circumflex  . Neurogenic bladder   . Onycholysis    Past Surgical History:  Procedure Laterality Date  . CATARACT EXTRACTION     cataract surgery bilateral   Family History  Problem Relation Age of Onset  . Hypertension Sister   . Thyroid nodules Sister   . Leukemia Brother   . Cancer Mother   . Hypertension Father   . Hyperlipidemia Father   . Coronary artery disease Neg Hx    Social History   Occupational History  . SALES [employer]   Social History Main Topics  . Smoking status: Former Smoker    Quit date: 10/24/1985  . Smokeless tobacco: Never Used  . Alcohol use Yes     Comment: on weekends  . Drug use: No  . Sexual activity: Yes    Partners: Female   Tobacco Counseling Counseling given: Not Answered   Activities of Daily Living In your present state of health, do you have any difficulty performing the following activities: 06/05/2016 02/17/2016  Hearing? N N  Vision? N N  Difficulty concentrating or making decisions? N N  Walking or climbing  stairs? N N  Dressing or bathing? N N  Doing errands, shopping? N N  Preparing Food and eating ? N -  Using the Toilet? N -  In the past six months, have you accidently leaked urine? N -  Do you have problems with loss of bowel control? N -  Managing your Medications? N -  Managing your Finances? N -  Housekeeping or managing your Housekeeping? N -  Some recent data might be hidden    Immunizations and Health Maintenance Immunization History  Administered Date(s) Administered  . Influenza Whole 04/15/2009  . Influenza, High Dose Seasonal PF 02/17/2016  . Influenza,inj,Quad PF,6-35 Mos 03/19/2014  . Pneumococcal Conjugate-13 06/05/2016     Health Maintenance Due  Topic Date Due  . Hepatitis C Screening  1945/08/27  . TETANUS/TDAP  09/01/1966  . Fecal DNA (Cologuard)  08/31/1997  . ZOSTAVAX  09/01/2007    Patient Care Team: Esperanza Richters, PA-C as PCP - General (Internal Medicine) Sallye Lat, MD as Consulting Physician (Ophthalmology) Kathleene Hazel, MD as Consulting Physician (Cardiology) Cherlyn Roberts, MD as Consulting Physician (Dermatology) Ihor Gully, MD as Consulting Physician (Urology) Dominica Severin, MD as Consulting Physician (Orthopedic Surgery)  Indicate any recent Medical Services you may have received from other than [facility] providers in the past year (date may be approximate).    Assessment:   This is a routine wellness examination for Connor Walker. Physical assessment deferred to PCP.  Hearing/Vision screen Hearing Screening Comments: Able to hear conversational tones w/o difficulty. No issues reported. Passes whisper test. Vision Screening Comments: Wears reading glasses from [employer]. Follows w/ Dr. Dione Booze every 2 years. No issues reported.  Dietary issues and exercise activities discussed: Current Exercise Habits: The patient does not participate in regular exercise at present  Diet (meal preparation, eat out, water intake, caffeinated beverages, dairy products, fruits and vegetables): in general, a "healthy" diet  , well balanced, on average, 3 meals per day. Meals vary. Salmon w/ salad, broccoli, and potatoes or chicken w/ mushroom sauce and rice. Does not beef. Drinks water throughout the day. 4 cups coffee daily. Does not drink soda.  Breakfast: cereal Lunch: snack lunch Dinner: varies      Goals    . Increase physical activity    . Increase water intake      Depression Screen PHQ 2/9 Scores 06/05/2016 02/17/2016 09/21/2015 05/29/2015  PHQ - 2 Score 0 0 0 0  Exception Documentation - Patient refusal - -    Fall Risk Fall Risk  06/05/2016 02/17/2016 09/21/2015  Falls in the past year?  No No No    Cognitive Function: MMSE - Mini Mental State Exam 06/05/2016  Orientation to time 5  Orientation to Place 5  Registration 3  Attention/ Calculation 5  Recall 3  Language- name 2 objects 2  Language- repeat 1  Language- follow 3 step command 3  Language- read & follow direction 1  Write a sentence 1  Copy design 1  Total score 30        Screening Tests Health Maintenance  Topic Date Due  . Hepatitis C Screening  30-May-1945  . TETANUS/TDAP  09/01/1966  . Fecal DNA (Cologuard)  08/31/1997  . ZOSTAVAX  09/01/2007  . PNA vac Low Risk Adult (2 of 2 - PPSV23) 06/05/2017  . INFLUENZA VACCINE  Completed        Plan:   Follow-up w/ PCP as scheduled for CPE in 2 weeks. Hep C screening w/ CPE.  Pt to  call insurance company regarding coverage for Zostavax. Orders placed for Cologuard via portal. Prenvar-13 given today.  During the course of the visit Jarmarcus was educated and counseled about the following appropriate screening and preventive services:   Vaccines to include Pneumoccal, Influenza, Hepatitis B, Td, Zostavax, HCV  Colorectal cancer screening  Cardiovascular disease screening  Diabetes screening  Glaucoma screening  Nutrition counseling  Prostate cancer screening  Patient Instructions (the written plan) were given to the patient.   Starla Link, RN   06/05/2016        Agree with the above evaluation assessment and plan. Will discuss and review with patient on follow up.  Saguier, Ramon Dredge, PA-C

## 2016-06-05 ENCOUNTER — Ambulatory Visit (INDEPENDENT_AMBULATORY_CARE_PROVIDER_SITE_OTHER): Payer: Medicare Other | Admitting: *Deleted

## 2016-06-05 ENCOUNTER — Encounter: Payer: Self-pay | Admitting: *Deleted

## 2016-06-05 VITALS — BP 120/84 | HR 63 | Resp 14 | Ht 74.0 in | Wt 194.2 lb

## 2016-06-05 DIAGNOSIS — Z23 Encounter for immunization: Secondary | ICD-10-CM | POA: Diagnosis not present

## 2016-06-05 DIAGNOSIS — Z0000 Encounter for general adult medical examination without abnormal findings: Secondary | ICD-10-CM

## 2016-06-05 NOTE — Patient Instructions (Addendum)
Mr. Cornia , Thank you for taking time to come for your Medicare Wellness Visit. I appreciate your ongoing commitment to your health goals. Please review the following plan we discussed and let me know if I can assist you in the future.   These are the goals we discussed: Goals    . Increase physical activity    . Increase water intake       This is a list of the screening recommended for you and due dates:  Health Maintenance  Topic Date Due  .  Hepatitis C: One time screening is recommended by Center for Disease Control  (CDC) for  adults born from [birth year] through 1963.   08-04-1945  . Tetanus Vaccine  09/01/1966  . Cologuard (Stool DNA test)  08/31/1997  . Shingles Vaccine  09/01/2007  . Pneumonia vaccines (1 of 2 - PCV13) 08/31/2012  . Flu Shot  Completed   Pneumococcal Conjugate Vaccine (PCV13) What You Need to Know 1. Why get vaccinated? Vaccination can protect both children and adults from pneumococcal disease. Pneumococcal disease is caused by bacteria that can spread from person to person through close contact. It can cause ear infections, and it can also lead to more serious infections of the:  Lungs (pneumonia),  Blood (bacteremia), and  Covering of the brain and spinal cord (meningitis). Pneumococcal pneumonia is most common among adults. Pneumococcal meningitis can cause deafness and brain damage, and it kills about 1 child in 10 who get it. Anyone can get pneumococcal disease, but children under 23 years of age and adults 25 years and older, people with certain medical conditions, and cigarette smokers are at the highest risk. Before there was a vaccine, the [place] saw:  more than 700 cases of meningitis,  about 13,000 blood infections,  about 5 million ear infections, and  about 200 deaths in children under 5 each year from pneumococcal disease. Since vaccine became available, severe pneumococcal disease in these children has fallen by 88%. About 18,000  older adults die of pneumococcal disease each year in the [place]. Treatment of pneumococcal infections with penicillin and other drugs is not as effective as it used to be, because some strains of the disease have become resistant to these drugs. This makes prevention of the disease, through vaccination, even more important. 2. PCV13 vaccine Pneumococcal conjugate vaccine (called PCV13) protects against 13 types of pneumococcal bacteria. PCV13 is routinely given to children at 2, 4, 6, and 27-63 months of age. It is also recommended for children and adults 28 to 51 years of age with certain health conditions, and for all adults 34 years of age and older. Your doctor can give you details. 3. Some people should not get this vaccine Anyone who has ever had a life-threatening allergic reaction to a dose of this vaccine, to an earlier pneumococcal vaccine called PCV7, or to any vaccine containing diphtheria toxoid (for example, DTaP), should not get PCV13. Anyone with a severe allergy to any component of PCV13 should not get the vaccine. Tell your doctor if the person being vaccinated has any severe allergies. If the person scheduled for vaccination is not feeling well, your healthcare provider might decide to reschedule the shot on another day. 4. Risks of a vaccine reaction With any medicine, including vaccines, there is a chance of reactions. These are usually mild and go away on their own, but serious reactions are also possible. Problems reported following PCV13 varied by age and dose in the series. The  most common problems reported among children were:  About half became drowsy after the shot, had a temporary loss of appetite, or had redness or tenderness where the shot was given.  About 1 out of 3 had swelling where the shot was given.  About 1 out of 3 had a mild fever, and about 1 in 20 had a fever over 102.77F.  Up to about 8 out of 10 became fussy or irritable. Adults have reported  pain, redness, and swelling where the shot was given; also mild fever, fatigue, headache, chills, or muscle pain. Young children who get PCV13 along with inactivated flu vaccine at the same time may be at increased risk for seizures caused by fever. Ask your doctor for more information. Problems that could happen after any vaccine:  People sometimes faint after a medical procedure, including vaccination. Sitting or lying down for about 15 minutes can help prevent fainting, and injuries caused by a fall. Tell your doctor if you feel dizzy, or have vision changes or ringing in the ears.  Some older children and adults get severe pain in the shoulder and have difficulty moving the arm where a shot was given. This happens very rarely.  Any medication can cause a severe allergic reaction. Such reactions from a vaccine are very rare, estimated at about 1 in a million doses, and would happen within a few minutes to a few hours after the vaccination. As with any medicine, there is a very small chance of a vaccine causing a serious injury or death. The safety of vaccines is always being monitored. For more information, visit: http://floyd.org/www.cdc.gov/vaccinesafety/ 5. What if there is a serious reaction? What should I look for? Look for anything that concerns you, such as signs of a severe allergic reaction, very high fever, or unusual behavior. Signs of a severe allergic reaction can include hives, swelling of the face and throat, difficulty breathing, a fast heartbeat, dizziness, and weakness-usually within a few minutes to a few hours after the vaccination. What should I do?  If you think it is a severe allergic reaction or other emergency that can't wait, call 9-1-1 or get the person to the nearest hospital. Otherwise, call your doctor.  Reactions should be reported to the Vaccine Adverse Event Reporting System (VAERS). Your doctor should file this report, or you can do it yourself through the VAERS web site at  www.vaers.LAgents.nohhs.gov, or by calling 1-(902) 271-0630.  VAERS does not give medical advice. 6. The National Vaccine Injury Compensation Program The [employer] Vaccine Injury Compensation Program (VICP) is a federal program that was created to compensate people who may have been injured by certain vaccines. Persons who believe they may have been injured by a vaccine can learn about the program and about filing a claim by calling 1-(636)469-0569 or visiting the VICP website at SpiritualWord.atwww.hrsa.gov/vaccinecompensation. There is a time limit to file a claim for compensation. 7. How can I learn more?  Ask your healthcare provider. He or she can give you the vaccine package insert or suggest other sources of information.  Call your local or state health department.  Contact the Centers for Disease Control and Prevention (CDC):  Call 218 382 14021-214-056-0341 (1-800-CDC-INFO) or  Visit CDC's website at PicCapture.uywww.cdc.gov/vaccines Vaccine Information Statement, PCV13 Vaccine (04/01/2014) This information is not intended to replace advice given to you by your health care provider. Make sure you discuss any questions you have with your health care provider. Document Released: 03/11/2006 Document Revised: 02/02/2016 Document Reviewed: 02/02/2016 Elsevier Interactive Patient Education  2017 Elsevier Inc.  

## 2016-06-12 ENCOUNTER — Encounter: Payer: Self-pay | Admitting: Medical

## 2016-06-15 ENCOUNTER — Telehealth: Payer: Self-pay | Admitting: Medical

## 2016-06-15 NOTE — Telephone Encounter (Signed)
Caller name:[facility]- Ethelene Brownsnthony Relationship to patient: Can be [phone] ext [phone] Pharmacy:  Reason for call:Questions on [employer] Order. Requesting to speak to assistant.

## 2016-06-16 LAB — COLOGUARD

## 2016-06-17 NOTE — Telephone Encounter (Signed)
I will try to call [facility] on Monday but if get busy I might ask you to call about their question or concern on cologuard.. She Victorino DikeJennifer note on number and extension. By 1 pm touch base with I will let you know if I need you to call and take message on what is issue?

## 2016-06-17 NOTE — Telephone Encounter (Deleted)
I will try to call [facility] on Monday but if get busy I might ask you to call and take  Message regarding question or concern. Depends how busy I am on Monday. Saw the note on Friday and was too busy to call.

## 2016-06-18 ENCOUNTER — Telehealth: Payer: Self-pay | Admitting: Medical

## 2016-06-18 NOTE — Telephone Encounter (Signed)
Sales promotion account executiveCalled [facility] and spoke to Weyerhaeuser CompanyMary Walker.  Connor ClientMary Walker stated facility called because this was their first order they received from [facility] Inc[facility] Saguier, PA-C and wanted to verify his information.  Provider information verified.  Connor ClientMary Walker informed me that stool was collected and processed.  Results were negative and were faxed to our office on 06/16/16.  Connor ClientMary Walker stated if we haven't received fax, to call back and they would refax results to us.

## 2016-06-18 NOTE — Telephone Encounter (Signed)
Results printed from [facility] and forwarded to PCP for review.

## 2016-06-18 NOTE — Telephone Encounter (Signed)
I have not seen the result of his cologuard yet?

## 2016-06-19 ENCOUNTER — Telehealth: Payer: Self-pay | Admitting: Medical

## 2016-06-19 NOTE — Telephone Encounter (Signed)
Will you let pt know that his cologuard test came back negative.

## 2016-06-19 NOTE — Telephone Encounter (Signed)
Patient informed, understood/SLS 01/23

## 2016-06-20 ENCOUNTER — Ambulatory Visit (INDEPENDENT_AMBULATORY_CARE_PROVIDER_SITE_OTHER): Payer: Medicare Other | Admitting: Medical

## 2016-06-20 ENCOUNTER — Encounter: Payer: Self-pay | Admitting: Medical

## 2016-06-20 VITALS — BP 132/77 | HR 67 | Temp 97.9°F | Resp 16 | Ht 74.0 in | Wt 193.2 lb

## 2016-06-20 DIAGNOSIS — Z0000 Encounter for general adult medical examination without abnormal findings: Secondary | ICD-10-CM | POA: Diagnosis not present

## 2016-06-20 DIAGNOSIS — Z1159 Encounter for screening for other viral diseases: Secondary | ICD-10-CM | POA: Diagnosis not present

## 2016-06-20 LAB — URINALYSIS, ROUTINE W REFLEX MICROSCOPIC
Bilirubin Urine: NEGATIVE
Hgb urine dipstick: NEGATIVE
KETONES UR: NEGATIVE
Leukocytes, UA: NEGATIVE
NITRITE: NEGATIVE
PH: 6 (ref 5.0–8.0)
RBC / HPF: NONE SEEN (ref 0–?)
Total Protein, Urine: NEGATIVE
Urine Glucose: NEGATIVE
Urobilinogen, UA: 0.2 (ref 0.0–1.0)
WBC UA: NONE SEEN (ref 0–?)

## 2016-06-20 LAB — TSH: TSH: 1.26 u[IU]/mL (ref 0.35–4.50)

## 2016-06-20 LAB — COMPREHENSIVE METABOLIC PANEL
ALBUMIN: 4.4 g/dL (ref 3.5–5.2)
ALK PHOS: 52 U/L (ref 39–117)
ALT: 39 U/L (ref 0–53)
AST: 28 U/L (ref 0–37)
BUN: 10 mg/dL (ref 6–23)
CHLORIDE: 105 meq/L (ref 96–112)
CO2: 28 mEq/L (ref 19–32)
Calcium: 9.3 mg/dL (ref 8.4–10.5)
Creatinine, Ser: 0.9 mg/dL (ref 0.40–1.50)
GFR: 88.98 mL/min (ref >60.00)
GLUCOSE: 97 mg/dL (ref 70–99)
POTASSIUM: 4.3 meq/L (ref 3.5–5.1)
SODIUM: 138 meq/L (ref 135–145)
Total Bilirubin: 0.6 mg/dL (ref 0.2–1.2)
Total Protein: 6.9 g/dL (ref 6.0–8.3)

## 2016-06-20 LAB — CBC WITH DIFFERENTIAL/PLATELET
BASOS PCT: 0.3 % (ref 0.0–3.0)
Basophils Absolute: 0 10*3/uL (ref 0.0–0.1)
EOS PCT: 2 % (ref 0.0–5.0)
Eosinophils Absolute: 0.1 10*3/uL (ref 0.0–0.7)
HCT: 45.7 % (ref 39.0–52.0)
Hemoglobin: 15.5 g/dL (ref 13.0–17.0)
LYMPHS ABS: 1.3 10*3/uL (ref 0.7–4.0)
Lymphocytes Relative: 28.6 % (ref 12.0–46.0)
MCHC: 34 g/dL (ref 30.0–36.0)
MCV: 99.3 fl (ref 78.0–100.0)
MONO ABS: 0.6 10*3/uL (ref 0.1–1.0)
MONOS PCT: 12.6 % — AB (ref 3.0–12.0)
NEUTROS ABS: 2.6 10*3/uL (ref 1.4–7.7)
NEUTROS PCT: 56.5 % (ref 43.0–77.0)
Platelets: 186 10*3/uL (ref 150.0–400.0)
RBC: 4.6 Mil/uL (ref 4.22–5.81)
RDW: 13.8 % (ref 11.5–15.5)
WBC: 4.6 10*3/uL (ref 4.0–10.5)

## 2016-06-20 LAB — LIPID PANEL
CHOLESTEROL: 145 mg/dL (ref 0–200)
HDL: 44.8 mg/dL (ref >39.00)
LDL CALC: 80 mg/dL (ref 0–99)
NonHDL: 100
Total CHOL/HDL Ratio: 3
Triglycerides: 100 mg/dL (ref 0.0–149.0)
VLDL: 20 mg/dL (ref 0.0–40.0)

## 2016-06-20 LAB — HEPATITIS C ANTIBODY: HCV AB: NEGATIVE

## 2016-06-20 NOTE — Patient Instructions (Addendum)
For you wellness exam today I have ordered cbc, cmp, tsh, hep c screening,  lipid panel, and  ua.  Reminder to check on shingles vaccine coverage. If covered you could call back and schedule nurse vaccine visit.  Recommend exercise and healthy diet.  We will let you know lab results as they come in.  Follow up date appointment will be determined after lab review.    Preventive Care 34 Years and Older, Male Preventive care refers to lifestyle choices and visits with your health care provider that can promote health and wellness. What does preventive care include?  A yearly physical exam. This is also called an annual well check.  Dental exams once or twice a year.  Routine eye exams. Ask your health care provider how often you should have your eyes checked.  Personal lifestyle choices, including:  Daily care of your teeth and gums.  Regular physical activity.  Eating a healthy diet.  Avoiding tobacco and drug use.  Limiting alcohol use.  Practicing safe sex.  Taking low doses of aspirin every day.  Taking vitamin and mineral supplements as recommended by your health care provider. What happens during an annual well check? The services and screenings done by your health care provider during your annual well check will depend on your age, overall health, lifestyle risk factors, and family history of disease. Counseling  Your health care provider may ask you questions about your:  Alcohol use.  Tobacco use.  Drug use.  Emotional well-being.  Home and relationship well-being.  Sexual activity.  Eating habits.  History of falls.  Memory and ability to understand (cognition).  Work and work Statistician. Screening  You may have the following tests or measurements:  Height, weight, and BMI.  Blood pressure.  Lipid and cholesterol levels. These may be checked every 5 years, or more frequently if you are over 52 years old.  Skin check.  Lung cancer  screening. You may have this screening every year starting at age 57 if you have a 30-pack-year history of smoking and currently smoke or have quit within the past 15 years.  Fecal occult blood test (FOBT) of the stool. You may have this test every year starting at age 48.  Flexible sigmoidoscopy or colonoscopy. You may have a sigmoidoscopy every 5 years or a colonoscopy every 10 years starting at age 26.  Prostate cancer screening. Recommendations will vary depending on your family history and other risks.  Hepatitis C blood test.  Hepatitis B blood test.  Sexually transmitted disease (STD) testing.  Diabetes screening. This is done by checking your blood sugar (glucose) after you have not eaten for a while (fasting). You may have this done every 1-3 years.  Abdominal aortic aneurysm (AAA) screening. You may need this if you are a current or former smoker.  Osteoporosis. You may be screened starting at age 36 if you are at high risk. Talk with your health care provider about your test results, treatment options, and if necessary, the need for more tests. Vaccines  Your health care provider may recommend certain vaccines, such as:  Influenza vaccine. This is recommended every year.  Tetanus, diphtheria, and acellular pertussis (Tdap, Td) vaccine. You may need a Td booster every 10 years.  Varicella vaccine. You may need this if you have not been vaccinated.  Zoster vaccine. You may need this after age 20.  Measles, mumps, and rubella (MMR) vaccine. You may need at least one dose of MMR if you were  born in [birth year] or later. You may also need a second dose.  Pneumococcal 13-valent conjugate (PCV13) vaccine. One dose is recommended after age 43.  Pneumococcal polysaccharide (PPSV23) vaccine. One dose is recommended after age 56.  Meningococcal vaccine. You may need this if you have certain conditions.  Hepatitis A vaccine. You may need this if you have certain conditions or if you  travel or work in places where you may be exposed to hepatitis A.  Hepatitis B vaccine. You may need this if you have certain conditions or if you travel or work in places where you may be exposed to hepatitis B.  Haemophilus influenzae type b (Hib) vaccine. You may need this if you have certain risk factors. Talk to your health care provider about which screenings and vaccines you need and how often you need them. This information is not intended to replace advice given to you by your health care provider. Make sure you discuss any questions you have with your health care provider. Document Released: 06/10/2015 Document Revised: 02/01/2016 Document Reviewed: 03/15/2015 Elsevier Interactive Patient Education  2017 [employer].

## 2016-06-20 NOTE — Progress Notes (Signed)
Pre visit review using our clinic review tool, if applicable. No additional management support is needed unless otherwise documented below in the visit note/SLS  

## 2016-06-20 NOTE — Progress Notes (Signed)
Subjective:    Patient ID: Connor Walker, male    DOB: [birth year], 71 y.o.   MRN: [id number]  HPI  Pt in today feeling well.   Pt works as Research officer, political party. Not exercising much, Drinks 2-3 cups coffee a day, drinks socially on weekends. Non smoker. Stopped 40+ years ago.  Pt up to date on flu. Go pneumovaccine last visit.  Cologuard was negative.  Pt is seeing urologist on regular basis. PSA followed.   Pt has not called his insurance to check if shingles vaccine covered.    Review of Systems  Constitutional: Negative for chills, fatigue and fever.  HENT: Negative for congestion, drooling and ear pain.   Respiratory: Negative for cough, chest tightness, shortness of breath and wheezing.   Cardiovascular: Negative for chest pain and palpitations.  Gastrointestinal: Negative for abdominal pain, blood in stool, constipation, diarrhea and vomiting.  Genitourinary: Negative for difficulty urinating, flank pain, frequency, penile pain, testicular pain and urgency.  Musculoskeletal: Negative for back pain and neck pain.  Skin:       Scattered freckles on back. No worrisome lesions.Pt saw derm for sebacious cyst removal.  Psychiatric/Behavioral: Negative for agitation and confusion. The patient is not nervous/anxious.     Past Medical History:  Diagnosis Date  . Coronary artery disease June 2010   CAD native vessel with drug eluting stents in RCA and Circumflex  . Neurogenic bladder   . Onycholysis      Social History   Social History  . Marital status: Married    Spouse name: Gavin Pound  . Number of children: 3  . Years of education: 79   Occupational History  . 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   Social History Main Topics  . Smoking status: Former Smoker    Quit date: 10/24/1985  . Smokeless tobacco: Never Used  . Alcohol use Yes     Comment: on weekends  . Drug use: No  . Sexual activity: Yes    Partners: Female   Other Topics Concern  . Not on file   Social History  Narrative   Full time-[employer]. Married, 4 children(3 biological) (Wife - Deborrah, works at [employer])    Past Surgical History:  Procedure Laterality Date  . CATARACT EXTRACTION     cataract surgery bilateral    Family History  Problem Relation Age of Onset  . Hypertension Sister   . Thyroid nodules Sister   . Leukemia Brother   . Cancer Mother   . Hypertension Father   . Hyperlipidemia Father   . Coronary artery disease Neg Hx     No Known Allergies  Current Outpatient Prescriptions on File Prior to Visit  Medication Sig Dispense Refill  . aspirin 81 MG EC tablet Take 81 mg by mouth daily.      Marland Kitchen atorvastatin (LIPITOR) 80 MG tablet Take 1 tablet by mouth  daily 90 tablet 3  . clopidogrel (PLAVIX) 75 MG tablet Take 1 tablet by mouth  daily 90 tablet 3  . metoprolol tartrate (LOPRESSOR) 25 MG tablet Take one-half tablet by  mouth twice a day 90 tablet 3  . nitroGLYCERIN (NITROSTAT) 0.4 MG SL tablet Place 1 tablet (0.4 mg total) under the tongue every 5 (five) minutes as needed. Up to 3 doses 25 tablet 6   No current facility-administered medications on file prior to visit.     BP 132/77 (BP Location: Left Arm, Patient Position: Sitting, Cuff Size: Large)   Pulse 67  Temp 97.9 F (36.6 C) (Oral)   Resp 16   Ht 6\' 2"  (1.88 m)   Wt 193 lb 4 oz (87.7 kg)   SpO2 100%   BMI 24.81 kg/m       Objective:   Physical Exam  General Mental Status- Alert. General Appearance- Not in acute distress.    Heent- no sinus pressure. No lymphadenopathy. Nares clear.  Skin General: Color- Normal Color. Moisture- Normal Moisture. Scattered freckles over anterior and posterior thorax. But no worrisome lesions.  Neck Carotid Arteries- Normal color. Moisture- Normal Moisture. No carotid bruits. No JVD.  Chest and Lung Exam Auscultation: Breath Sounds:-Normal.  Cardiovascular Auscultation:Rythm- Regular. Murmurs & Other Heart Sounds:Auscultation of the heart  reveals- No Murmurs.  Abdomen Inspection:-Inspeection Normal. Palpation/Percussion:Note:No mass. Palpation and Percussion of the abdomen reveal- Non Tender, Non Distended + BS, no rebound or guarding.    Neurologic Cranial Nerve exam:- CN III-XII intact(No nystagmus), symmetric smile. Strength:- 5/5 equal and symmetric strength both upper and lower extremities.  Rectal and genital- deferred since just saw urologist.      Assessment & Plan:  For you wellness exam today I have ordered cbc, cmp, tsh, hep c screening,  lipid panel, and  ua.  Reminder to check on shingles vaccine coverage. If covered you could call back and schedule nurse vaccine visit.  Recommend exercise and healthy diet.  We will let you know lab results as they come in.  Follow up date appointment will be determined after lab review.  Regarding tetanus informed likely not covered. But he will check with his insurance and we will give if it is covered.(no recent cuts or lacerations reported)     Challis Crill, Ramon DredgeEdward, PA-C

## 2016-06-27 ENCOUNTER — Ambulatory Visit (INDEPENDENT_AMBULATORY_CARE_PROVIDER_SITE_OTHER): Payer: Medicare Other

## 2016-06-27 DIAGNOSIS — Z2911 Encounter for prophylactic immunotherapy for respiratory syncytial virus (RSV): Secondary | ICD-10-CM

## 2016-06-27 DIAGNOSIS — Z23 Encounter for immunization: Secondary | ICD-10-CM | POA: Diagnosis not present

## 2016-06-27 NOTE — Progress Notes (Signed)
Pre visit review using our clinic tool,if applicable. No additional management support is needed unless otherwise documented below in the visit note.   Patient in for Tdap and Zoster Vaccines per order from E. Saguier PA-C.. Given IM Left and Right deltoid. Patient tolerated well.

## 2016-07-05 ENCOUNTER — Encounter: Payer: Self-pay | Admitting: Medical

## 2016-07-05 ENCOUNTER — Ambulatory Visit (INDEPENDENT_AMBULATORY_CARE_PROVIDER_SITE_OTHER): Payer: Medicare Other | Admitting: Medical

## 2016-07-05 VITALS — BP 128/68 | HR 57 | Temp 98.4°F | Ht 74.0 in | Wt 194.0 lb

## 2016-07-05 DIAGNOSIS — H00015 Hordeolum externum left lower eyelid: Secondary | ICD-10-CM

## 2016-07-05 MED ORDER — AMOXICILLIN-POT CLAVULANATE 875-125 MG PO TABS: 1.0000 | ORAL_TABLET | Freq: Two times a day (BID) | ORAL | 0 refills | Status: DC

## 2016-07-05 MED ORDER — TOBRAMYCIN 0.3 % OP SOLN: 2.0000 [drp] | Freq: Four times a day (QID) | OPHTHALMIC | 0 refills | Status: DC

## 2016-07-05 MED FILL — TOBRAMYCIN 0.3% EYE DROPS: 0.3 | 13 days supply | Qty: 5 | Fill #0

## 2016-07-05 NOTE — Patient Instructions (Signed)
You appear to have moderate sized stye. Advise warm compresses twice daily and start tobrex. If inflammation worsens despite compress and eye drops then start augmentin.   If severe swelling or localized redness spread down to cheek then ED evaluation.  Follow up in 7 days or as needed

## 2016-07-05 NOTE — Progress Notes (Signed)
Pre visit review using our clinic tool,if applicable. No additional management support is needed unless otherwise documented below in the visit note.  

## 2016-07-05 NOTE — Progress Notes (Signed)
Subjective:    Patient ID: Connor Walker, male    DOB: 1946/05/26, 71 y.o.   MRN: [id number]  HPI   Pt woke up yesterday morning with left lower led region pain and swelling. Most pain on closing his eye. No dc to eye. No trauma or injury. No vision changes No light flashing. No spots in vision field. No blurred vision.   Review of Systems  Constitutional: Negative for chills, fatigue and fever.  HENT: Negative for congestion, postnasal drip, rhinorrhea, sinus pain and sinus pressure.        Stye.  Respiratory: Negative for chest tightness and wheezing.   Cardiovascular: Negative for chest pain and palpitations.  Gastrointestinal: Negative for abdominal pain.  Musculoskeletal: Negative for back pain and myalgias.  Skin: Negative for rash.       Moderate red around the stye.   Past Medical History:  Diagnosis Date  . Coronary artery disease June 2010   CAD native vessel with drug eluting stents in RCA and Circumflex  . Neurogenic bladder   . Onycholysis      Social History   Social History  . Marital status: Married    Spouse name: Gavin Pound  . Number of children: 3  . Years of education: 31   Occupational History  . 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   Social History Main Topics  . Smoking status: Former Smoker    Quit date: 10/24/1985  . Smokeless tobacco: Never Used  . Alcohol use Yes     Comment: on weekends  . Drug use: No  . Sexual activity: Yes    Partners: Female   Other Topics Concern  . Not on file   Social History Narrative   Full time-[employer]. Married, 4 children(3 biological) (Wife - Deborrah, works at [employer])    Past Surgical History:  Procedure Laterality Date  . CATARACT EXTRACTION     cataract surgery bilateral    Family History  Problem Relation Age of Onset  . Hypertension Sister   . Thyroid nodules Sister   . Leukemia Brother   . Cancer Mother   . Hypertension Father   . Hyperlipidemia Father   . Coronary artery disease  Neg Hx     No Known Allergies  Current Outpatient Prescriptions on File Prior to Visit  Medication Sig Dispense Refill  . Ascorbic Acid (VITAMIN C) 100 MG tablet Take 100 mg by mouth daily.    Marland Kitchen aspirin 81 MG EC tablet Take 81 mg by mouth daily.      Marland Kitchen atorvastatin (LIPITOR) 80 MG tablet Take 1 tablet by mouth  daily 90 tablet 3  . clopidogrel (PLAVIX) 75 MG tablet Take 1 tablet by mouth  daily 90 tablet 3  . co-enzyme Q-10 30 MG capsule Take 30 mg by mouth daily.    . Cranberry 1000 MG CAPS Take by mouth daily.    . metoprolol tartrate (LOPRESSOR) 25 MG tablet Take one-half tablet by  mouth twice a day 90 tablet 3  . Multiple Vitamins-Minerals (MENS MULTIVITAMIN PLUS PO) Take by mouth daily.    . nitroGLYCERIN (NITROSTAT) 0.4 MG SL tablet Place 1 tablet (0.4 mg total) under the tongue every 5 (five) minutes as needed. Up to 3 doses 25 tablet 6  . vitamin B-12 (CYANOCOBALAMIN) 100 MCG tablet Take 100 mcg by mouth daily.     No current facility-administered medications on file prior to visit.     BP 128/68   Pulse (!) 57  Temp 98.4 F (36.9 C) (Oral)   Ht 6\' 2"  (1.88 m)   Wt 194 lb (88 kg)   SpO2 100%   BMI 24.91 kg/m       Objective:   Physical Exam  General- No acute distress. Pleasant patient. Lungs- Clear, even and unlabored. Heart- regular rate and rhythm. Neurologic- CNII- XII grossly intact.  Left eye- lower lid almost in corner aspect stye like small inflammation.No dc seen. Moderate. Moderate tender to light palpation.(no rash or vesicles seen)     Assessment & Plan:  You appear to have moderate sized stye. Advise warm compresses twice daily and start tobrex. If inflammation worsens despite compress and eye drops then start augmentin.   If severe swelling or localized redness spread down to cheek then ED evaluation.  Follow up in 7 days or as needed  Shamaria Kavan, Ramon DredgeEdward, [employer]F CorporationPA-C

## 2016-07-13 ENCOUNTER — Other Ambulatory Visit: Payer: Self-pay | Admitting: Cardiovascular Disease

## 2016-08-27 ENCOUNTER — Other Ambulatory Visit: Payer: Self-pay | Admitting: Cardiovascular Disease

## 2016-08-28 ENCOUNTER — Other Ambulatory Visit: Payer: Self-pay | Admitting: *Deleted

## 2016-08-28 MED ORDER — ATORVASTATIN CALCIUM 80 MG PO TABS: 80.0000 mg | ORAL_TABLET | Freq: Every day | ORAL | 0 refills | Status: DC

## 2016-08-28 NOTE — Telephone Encounter (Signed)
atorvastatin (LIPITOR) 80 MG tablet  Medication  Date: 07/13/2016 Department: [facility] [address] Office Ordering/Authorizing: Kathleene Hazel, MD  Order Providers   Prescribing Provider Encounter Provider  Kathleene Hazel, MD Kathleene Hazel, MD  Medication Detail    Disp Refills Start End   atorvastatin (LIPITOR) 80 MG tablet 30 tablet 0 07/13/2016    Sig - Route: Take 1 tablet (80 mg total) by mouth daily. OVERDUE FOR FOLLOW UP. CALL AND SCHEDULE [phone] - Oral   E-Prescribing Status: Receipt confirmed by pharmacy (07/13/2016 1:50 PM EST)   Pharmacy   [facility] - [place], [place] - [id number]

## 2016-09-25 ENCOUNTER — Other Ambulatory Visit: Payer: Self-pay | Admitting: Cardiovascular Disease

## 2016-09-25 MED ORDER — ATORVASTATIN CALCIUM 80 MG PO TABS: 80.0000 mg | ORAL_TABLET | Freq: Every day | ORAL | 0 refills | Status: DC

## 2016-09-25 MED ORDER — CLOPIDOGREL BISULFATE 75 MG PO TABS: 75.0000 mg | ORAL_TABLET | Freq: Every day | ORAL | 0 refills | Status: DC

## 2016-09-25 MED ORDER — METOPROLOL TARTRATE 25 MG PO TABS: 12.5000 mg | ORAL_TABLET | Freq: Two times a day (BID) | ORAL | 0 refills | Status: DC

## 2016-09-25 NOTE — Telephone Encounter (Signed)
Pt has scheduled an appointment for 11/19/16 with Dr. Clifton James and I refilled pt's medications for 90 days with [facility], as pt requested. I informed the pt that if he does not keep upcoming appointment that he would not be able to continue to ger refills. Pt verbalized understanding.

## 2016-10-02 ENCOUNTER — Encounter: Payer: Self-pay | Admitting: Cardiology

## 2016-10-03 ENCOUNTER — Encounter: Payer: Self-pay | Admitting: Cardiology

## 2016-10-03 ENCOUNTER — Ambulatory Visit (INDEPENDENT_AMBULATORY_CARE_PROVIDER_SITE_OTHER): Payer: Medicare Other | Admitting: Cardiology

## 2016-10-03 VITALS — BP 142/72 | HR 58 | Ht 74.0 in | Wt 192.0 lb

## 2016-10-03 DIAGNOSIS — I2510 Atherosclerotic heart disease of native coronary artery without angina pectoris: Secondary | ICD-10-CM | POA: Diagnosis not present

## 2016-10-03 MED ORDER — CLOPIDOGREL BISULFATE 75 MG PO TABS: 75.0000 mg | ORAL_TABLET | Freq: Every day | ORAL | 4 refills | Status: AC

## 2016-10-03 MED ORDER — ATORVASTATIN CALCIUM 80 MG PO TABS: 80.0000 mg | ORAL_TABLET | Freq: Every day | ORAL | 4 refills | Status: AC

## 2016-10-03 MED ORDER — METOPROLOL TARTRATE 25 MG PO TABS: 12.5000 mg | ORAL_TABLET | Freq: Two times a day (BID) | ORAL | 4 refills | Status: AC

## 2016-10-03 NOTE — Progress Notes (Signed)
10/03/2016 Connor Walker   07/16/1945  [id number]  Primary Physician Saguier, Kateri McEdward, PA-C Primary Cardiologist: Dr. Clifton JamesMcAlhany   Reason for Visit/CC: F/u for CAD; medication refills   HPI:  Connor Walker is a 71 y.o. male who is being seen today for f/u for CAD. He is in need of refills but has not been seen since 2016. He is a pt of Dr. Clifton JamesMcAlhany. In May 2010, he had a non-STEMI and underwent placement of drug-eluting stents in the proximal and mid RCA as well as the mid circumflex. He also has a history of hyperlipidemia which has been treated with statin therapy. His PCP follows his lipid profile. His most recent lipid panel was 06/20/2016. LDL was 80 mg/dL.   He reports that he is done well since his last office visit. No anginal symptoms. In 2010, he had left jaw pain. No recurrent symptoms since then. Also no CP or dyspnea. No exertional symptoms. He reports full medication compliance. Blood pressure is 140/72. Heart rate 58. EKG shows sinus bradycardia with a rate of 58 bpm and no ischemic abnormalities.   Current Meds  Medication Sig  . Ascorbic Acid (VITAMIN C) 100 MG tablet Take 100 mg by mouth daily.  Marland Kitchen. aspirin 81 MG EC tablet Take 81 mg by mouth daily.    Marland Kitchen. atorvastatin (LIPITOR) 80 MG tablet Take 1 tablet (80 mg total) by mouth daily.  . clopidogrel (PLAVIX) 75 MG tablet Take 1 tablet (75 mg total) by mouth daily.  Marland Kitchen. co-enzyme Q-10 30 MG capsule Take 30 mg by mouth daily.  . Cranberry 1000 MG CAPS Take by mouth daily.  . metoprolol tartrate (LOPRESSOR) 25 MG tablet Take 0.5 tablets (12.5 mg total) by mouth 2 (two) times daily.  . Multiple Vitamins-Minerals (MENS MULTIVITAMIN PLUS PO) Take by mouth daily.  . nitroGLYCERIN (NITROSTAT) 0.4 MG SL tablet Place 1 tablet (0.4 mg total) under the tongue every 5 (five) minutes as needed. Up to 3 doses  . vitamin B-12 (CYANOCOBALAMIN) 100 MCG tablet Take 100 mcg by mouth daily.   No Known Allergies Past Medical History:  Diagnosis  Date  . Coronary artery disease June 2010   CAD native vessel with drug eluting stents in RCA and Circumflex  . Neurogenic bladder   . Onycholysis    Family History  Problem Relation Age of Onset  . Hypertension Sister   . Thyroid nodules Sister   . Leukemia Brother   . Cancer Mother   . Hypertension Father   . Hyperlipidemia Father   . Coronary artery disease Neg Hx    Past Surgical History:  Procedure Laterality Date  . CATARACT EXTRACTION     cataract surgery bilateral   Social History   Social History  . Marital status: Married    Spouse name: Gavin PoundDeborah  . Number of children: 3  . 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   Social History Main Topics  . Smoking status: Former Smoker    Quit date: 10/24/1985  . Smokeless tobacco: Never Used  . Alcohol use Yes     Comment: on weekends  . Drug use: No  . Sexual activity: Yes    Partners: Female   Other Topics Concern  . Not on file   Social History Narrative   Full time-[employer]. Married, 4 children(3 biological) (Wife - Deborrah, works at [employer])     Review of Systems: General: negative for chills,  fever, night sweats or weight changes.  Cardiovascular: negative for chest pain, dyspnea on exertion, edema, orthopnea, palpitations, paroxysmal nocturnal dyspnea or shortness of breath Dermatological: negative for rash Respiratory: negative for cough or wheezing Urologic: negative for hematuria Abdominal: negative for nausea, vomiting, diarrhea, bright red blood per rectum, melena, or hematemesis Neurologic: negative for visual changes, syncope, or dizziness All other systems reviewed and are otherwise negative except as noted above.   Physical Exam:  Blood pressure (!) 142/72, pulse (!) 58, height 6\' 2"  (1.88 m), weight 192 lb (87.1 kg).  General appearance: alert, cooperative and no distress Neck: no carotid bruit and no JVD Lungs: clear to auscultation  bilaterally Heart: regular rate and rhythm, S1, S2 normal, no murmur, click, rub or gallop Extremities: extremities normal, atraumatic, no cyanosis or edema Pulses: 2+ and symmetric Skin: Skin color, texture, turgor normal. No rashes or lesions Neurologic: Grossly normal  EKG is bradycardia. 58 bpm. No ischemia. -- personally reviewed   ASSESSMENT AND PLAN:   1. CAD: h/o NSTEMI in 2010 with DES to  the proximal and mid RCA as well as the mid circumflex. His been stable without any recurrent anginal symptomatology. EKG shows sinus rhythm without ischemic abnormalities. Vital signs are stable. Continue ASA, statin and BB.   2. HLD: Lipid panel followed by PCP. Most recent FLP 05/2016 showed LDL at 80 mg/dL.  Goal in the setting of CAD is < 70 mg/dL. He reports full compliance with Lipitor, 80 mg nightly. We discussed diet and physical activity. He reports that he is not always mindful of his diet and sometimes eats high fat foods. He is also working to try to increase his physical activity. Pt informed that if he is unable to get LDL down to goal, then we will need to consider initiation of Zetia for better LDL reduction.    Follow-Up w/ Dr. Clifton James in 1 year   Yandiel Bergum Delmer Islam, MHS [facility] 10/03/2016 2:01 PM

## 2016-10-03 NOTE — Patient Instructions (Addendum)
Medication Instructions:   Your physician recommends that you continue on your current medications as directed. Please refer to the Current Medication list given to you today.   If you need a refill on your cardiac medications before your next appointment, please call your pharmacy.  Labwork: NONE ORDERED  TODAY    Testing/Procedures: NONE ORDERED  TODAY    Follow-Up:  Your physician wants you to [facility]: [facility] You will receive a reminder letter in the mail two months in advance. If you don't receive a letter, please call our office to schedule the follow-up appointment.     Any Other Special Instructions Will Be Listed Below (If Applicable).

## 2016-11-19 ENCOUNTER — Ambulatory Visit: Payer: Medicare Other | Admitting: Cardiovascular Disease

## 2017-08-01 ENCOUNTER — Telehealth: Payer: Self-pay | Admitting: *Deleted

## 2017-08-01 NOTE — Telephone Encounter (Signed)
Received request for Medical Records from [facility], Dr. Arlys JohnBrian Arcement from [facility]; forwarded to [place] for email/scan/SLS 03/07

## 2017-08-08 ENCOUNTER — Telehealth: Payer: Self-pay | Admitting: *Deleted

## 2017-08-08 NOTE — Telephone Encounter (Signed)
Received request for Medical Records from [facility], Dr. Arlys JohnBrian Arcement from [facility]; forwarded to [place] for email/scan/SLS 03/14

## 2018-07-17 IMAGING — DX DG CHEST 2V
2 series · 2 of 2 positions shown · non-contrast
Comparison: 10/24/2008

CLINICAL DATA: Cough for 2 months.  History of smoking.

EXAM:
CHEST  2 VIEW

[chest pa]
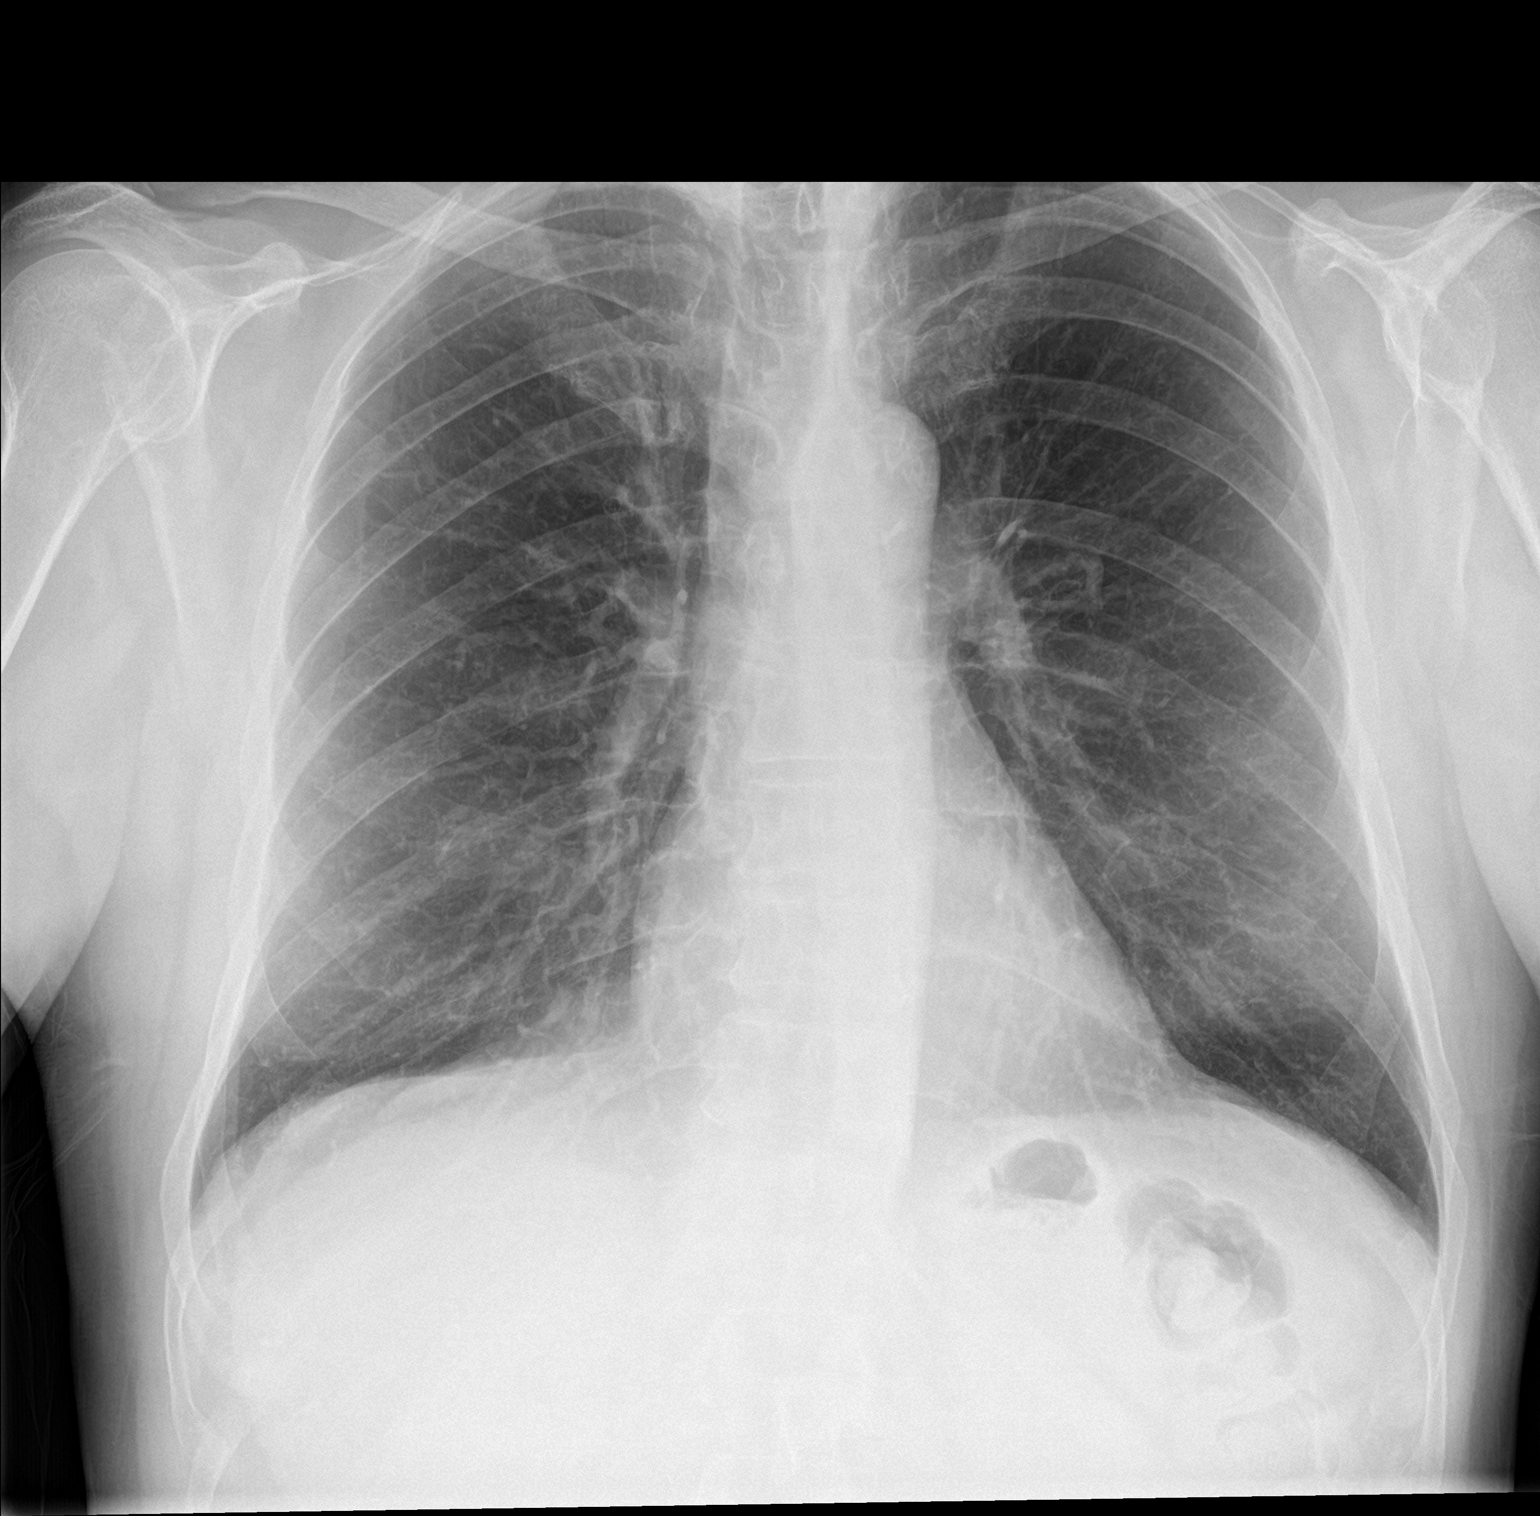

[chest lat]
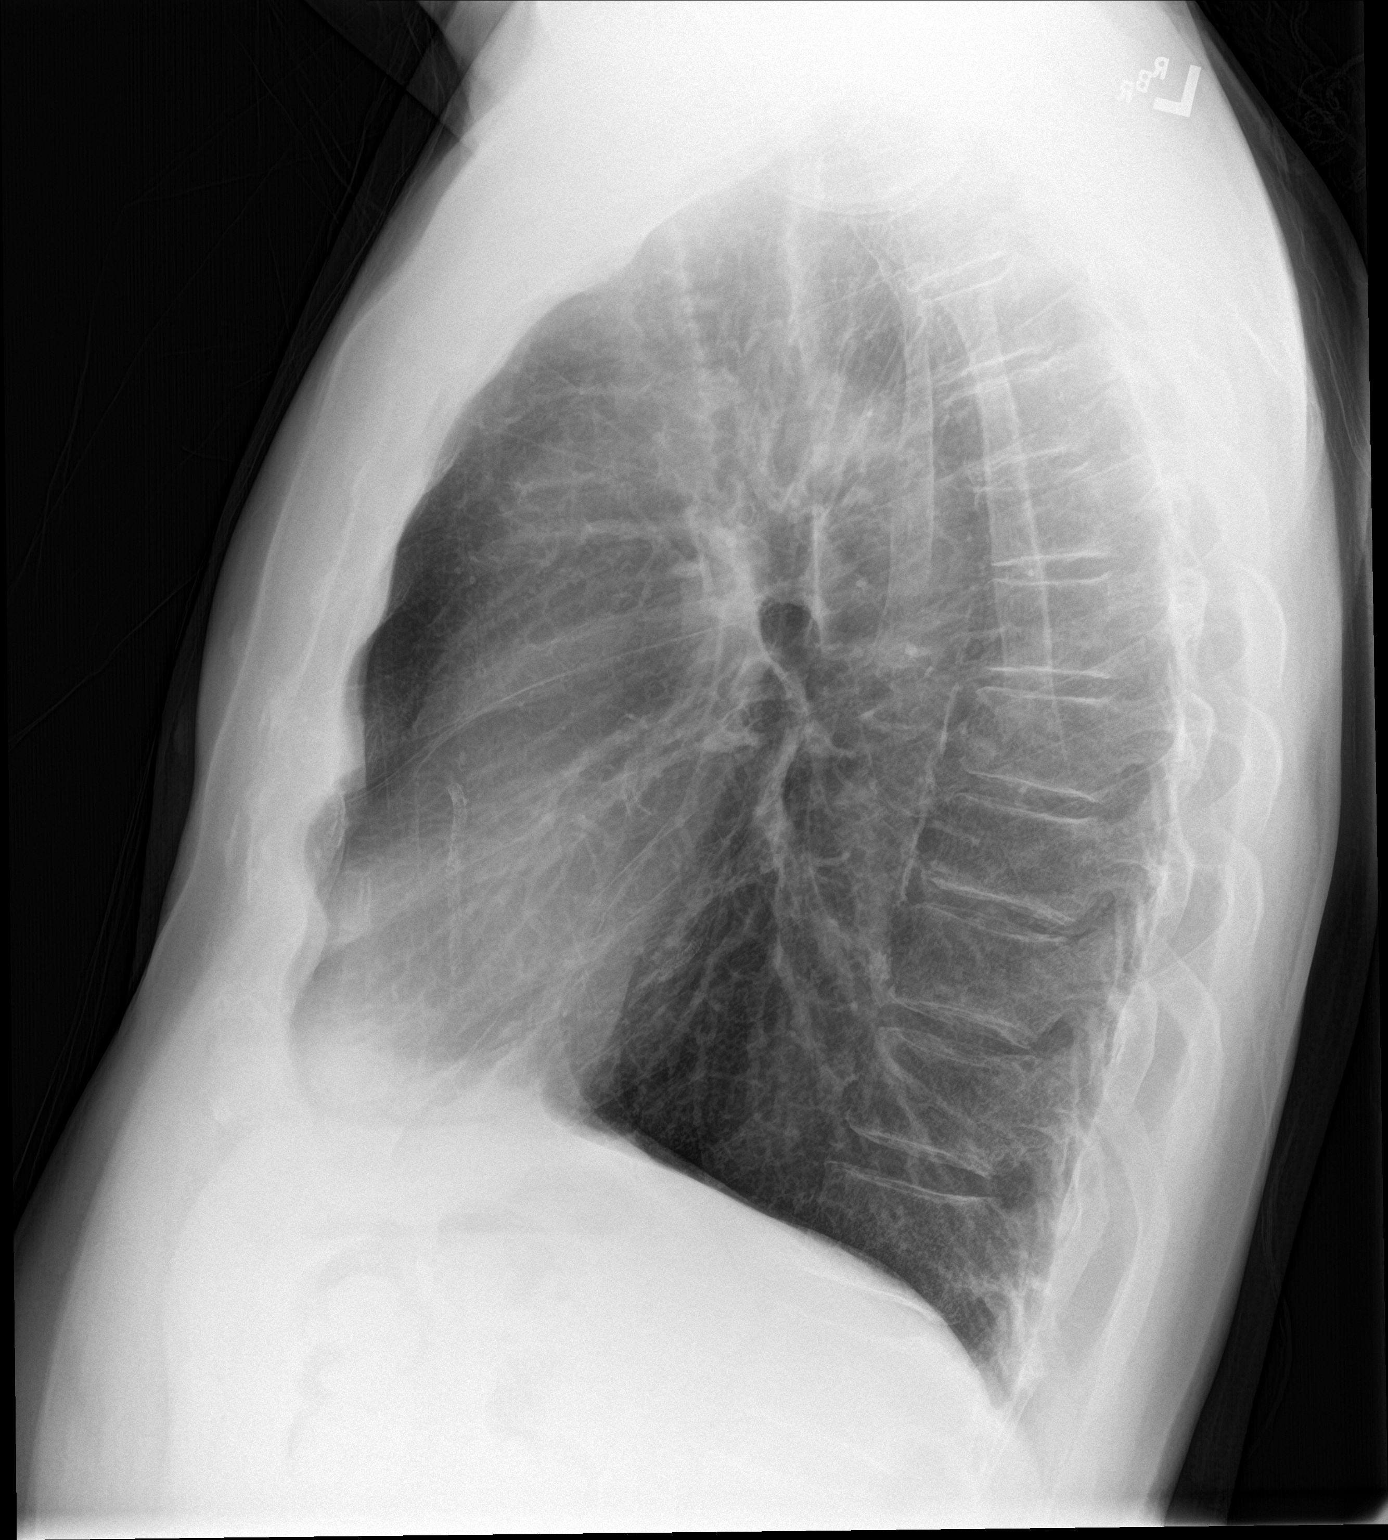

[2 of 2 positions shown; findings below may reference images not displayed]

FINDINGS: The cardiac silhouette, mediastinal and hilar contours are within
normal limits and stable. There is mild tortuosity and calcification
of the thoracic aorta. Suspect Coronary artery stents. The lungs are
clear of acute process. Suspect early emphysematous changes. No
pleural effusion.
IMPRESSION: Early emphysematous changes but no acute pulmonary findings.

Aortic atherosclerosis.

## 2019-10-05 ENCOUNTER — Encounter: Payer: Self-pay | Admitting: General Practice
